# Patient Record
Sex: Female | Born: 1935 | Race: White | Hispanic: No | Marital: Married | Smoking: Former smoker
Health system: Southern US, Community
[De-identification: ages and names within clinical notes are randomized; demographics above are authoritative.]

## PROBLEM LIST (undated history)

## (undated) HISTORY — PX: APPENDECTOMY: SHX54

## (undated) HISTORY — PX: ABDOMINAL HYSTERECTOMY: SHX81

---

## 1998-12-22 ENCOUNTER — Ambulatory Visit (HOSPITAL_COMMUNITY): Admission: RE | Admit: 1998-12-22 | Discharge: 1998-12-22 | Payer: Self-pay | Admitting: Obstetrics & Gynecology

## 1999-03-16 ENCOUNTER — Other Ambulatory Visit: Admission: RE | Admit: 1999-03-16 | Discharge: 1999-03-16 | Payer: Self-pay | Admitting: Podiatry

## 2007-02-05 ENCOUNTER — Ambulatory Visit: Payer: Self-pay | Admitting: Urology

## 2007-10-10 ENCOUNTER — Ambulatory Visit: Payer: Self-pay | Admitting: Internal Medicine

## 2008-04-28 ENCOUNTER — Ambulatory Visit: Payer: Self-pay | Admitting: Unknown Physician Specialty

## 2008-12-02 ENCOUNTER — Ambulatory Visit: Payer: Self-pay | Admitting: Internal Medicine

## 2008-12-18 ENCOUNTER — Ambulatory Visit: Payer: Self-pay | Admitting: Otolaryngology

## 2009-04-05 ENCOUNTER — Emergency Department: Payer: Self-pay | Admitting: Internal Medicine

## 2009-04-12 ENCOUNTER — Emergency Department: Payer: Self-pay | Admitting: Internal Medicine

## 2010-01-18 ENCOUNTER — Emergency Department: Payer: Self-pay | Admitting: Emergency Medicine

## 2010-02-01 ENCOUNTER — Ambulatory Visit: Payer: Self-pay | Admitting: Internal Medicine

## 2011-03-29 ENCOUNTER — Ambulatory Visit: Payer: Self-pay | Admitting: Internal Medicine

## 2011-09-01 ENCOUNTER — Ambulatory Visit: Payer: Self-pay | Admitting: Internal Medicine

## 2011-10-12 ENCOUNTER — Ambulatory Visit: Payer: Self-pay | Admitting: Ophthalmology

## 2011-10-12 DIAGNOSIS — I10 Essential (primary) hypertension: Secondary | ICD-10-CM

## 2011-10-25 ENCOUNTER — Ambulatory Visit: Payer: Self-pay | Admitting: Ophthalmology

## 2011-11-09 ENCOUNTER — Ambulatory Visit: Payer: Self-pay | Admitting: Internal Medicine

## 2011-11-12 ENCOUNTER — Ambulatory Visit: Payer: Self-pay | Admitting: Internal Medicine

## 2011-11-23 ENCOUNTER — Ambulatory Visit: Payer: Self-pay | Admitting: Ophthalmology

## 2011-12-20 ENCOUNTER — Ambulatory Visit: Payer: Self-pay | Admitting: Ophthalmology

## 2012-01-02 ENCOUNTER — Ambulatory Visit: Payer: Self-pay | Admitting: Internal Medicine

## 2012-01-02 LAB — CANCER CENTER HEMOGLOBIN: HGB: 11 g/dL — ABNORMAL LOW (ref 12.0–16.0)

## 2012-01-13 ENCOUNTER — Ambulatory Visit: Payer: Self-pay | Admitting: Internal Medicine

## 2012-02-06 LAB — CANCER CENTER HEMOGLOBIN: HGB: 10.7 g/dL — ABNORMAL LOW (ref 12.0–16.0)

## 2012-02-10 ENCOUNTER — Ambulatory Visit: Payer: Self-pay | Admitting: Internal Medicine

## 2012-04-05 ENCOUNTER — Ambulatory Visit: Payer: Self-pay | Admitting: Internal Medicine

## 2012-04-05 LAB — IRON AND TIBC: Iron: 76 ug/dL (ref 50–170)

## 2012-04-05 LAB — FERRITIN: Ferritin (ARMC): 7 ng/mL — ABNORMAL LOW (ref 8–388)

## 2012-04-11 ENCOUNTER — Ambulatory Visit: Payer: Self-pay | Admitting: Internal Medicine

## 2012-11-21 ENCOUNTER — Ambulatory Visit: Payer: Self-pay | Admitting: Internal Medicine

## 2012-11-22 ENCOUNTER — Ambulatory Visit: Payer: Self-pay | Admitting: Internal Medicine

## 2013-11-25 ENCOUNTER — Ambulatory Visit: Payer: Self-pay | Admitting: Internal Medicine

## 2014-02-24 ENCOUNTER — Ambulatory Visit: Payer: Self-pay | Admitting: Unknown Physician Specialty

## 2014-02-26 LAB — PATHOLOGY REPORT

## 2014-04-29 DIAGNOSIS — E782 Mixed hyperlipidemia: Secondary | ICD-10-CM | POA: Insufficient documentation

## 2014-04-29 DIAGNOSIS — D649 Anemia, unspecified: Secondary | ICD-10-CM | POA: Insufficient documentation

## 2014-04-29 DIAGNOSIS — K219 Gastro-esophageal reflux disease without esophagitis: Secondary | ICD-10-CM | POA: Insufficient documentation

## 2014-04-29 DIAGNOSIS — F5104 Psychophysiologic insomnia: Secondary | ICD-10-CM | POA: Insufficient documentation

## 2014-05-20 DIAGNOSIS — R0602 Shortness of breath: Secondary | ICD-10-CM | POA: Insufficient documentation

## 2014-12-15 ENCOUNTER — Ambulatory Visit: Payer: Self-pay | Admitting: Internal Medicine

## 2014-12-15 LAB — D-DIMER(ARMC): D-Dimer: 605 ng/ml

## 2014-12-16 ENCOUNTER — Ambulatory Visit: Payer: Self-pay | Admitting: Internal Medicine

## 2015-01-06 DIAGNOSIS — R079 Chest pain, unspecified: Secondary | ICD-10-CM | POA: Insufficient documentation

## 2015-02-24 ENCOUNTER — Ambulatory Visit: Payer: Self-pay | Admitting: Internal Medicine

## 2015-04-05 NOTE — Op Note (Signed)
PATIENT NAME:  Tamara Levine, Tamara Levine MR#:  [id number] DATE OF BIRTH:  [birth year]  DATE OF PROCEDURE:  12/20/2011  PREOPERATIVE DIAGNOSIS: Visually significant cataract of the right eye.   POSTOPERATIVE DIAGNOSIS: Visually significant cataract of the right eye.   OPERATIVE PROCEDURE: Cataract extraction by phacoemulsification with implant of intraocular lens to right eye.   SURGEON: Birder Robson, MD.   ANESTHESIA:  1. Managed anesthesia care.  2. Topical tetracaine drops followed by 2% Xylocaine jelly applied in the preoperative holding area.   COMPLICATIONS: None.   TECHNIQUE: Stop and chop.    DESCRIPTION OF PROCEDURE: The patient was examined and consented in the preoperative holding area where the aforementioned topical anesthesia was applied to the right eye and then brought back to the Operating Room where the right eye was prepped and draped in the usual sterile ophthalmic fashion and a lid speculum was placed. A paracentesis was created with the side port blade and the anterior chamber was filled with viscoelastic. A near clear corneal incision was performed with the steel keratome. A continuous curvilinear capsulorrhexis was performed with a cystotome followed by the capsulorrhexis forceps. Hydrodissection and hydrodelineation were carried out with BSS on a blunt cannula. The lens was removed in a stop and chop technique and the remaining cortical material was removed with the irrigation-aspiration handpiece. The capsular bag was inflated with viscoelastic and the Technis ZCB00 24.0-diopter lens, serial number [id number] was placed in the capsular bag without complication. The remaining viscoelastic was removed from the eye with the irrigation-aspiration handpiece. The wounds were hydrated. The anterior chamber was flushed with Miostat and the eye was inflated to physiologic pressure. The wounds were found to be water tight. The eye was dressed with Vigamox and Omnipred. The patient was given  protective glasses to wear throughout the day and a shield with which to sleep tonight. The patient was also given drops with which to begin a drop regimen today and will follow-up with me in one day.    ____________________________ Livingston Diones. Cotton Beckley, MD wlp:ap D: 12/20/2011 13:46:33 ET T: 12/20/2011 14:08:13 ET JOB#: [id number]  cc: Kaleya Douse L. Taeveon Keesling, MD, <Dictator> Livingston Diones Caterina Racine MD ELECTRONICALLY SIGNED 12/27/2011 12:18

## 2015-04-27 DIAGNOSIS — I10 Essential (primary) hypertension: Secondary | ICD-10-CM | POA: Insufficient documentation

## 2015-05-07 DIAGNOSIS — I340 Nonrheumatic mitral (valve) insufficiency: Secondary | ICD-10-CM | POA: Insufficient documentation

## 2016-02-04 DIAGNOSIS — N183 Chronic kidney disease, stage 3 unspecified: Secondary | ICD-10-CM | POA: Insufficient documentation

## 2016-06-28 ENCOUNTER — Other Ambulatory Visit: Payer: Self-pay | Admitting: Internal Medicine

## 2016-06-28 DIAGNOSIS — Z1231 Encounter for screening mammogram for malignant neoplasm of breast: Secondary | ICD-10-CM

## 2016-07-05 ENCOUNTER — Other Ambulatory Visit: Payer: Self-pay | Admitting: Internal Medicine

## 2016-07-05 ENCOUNTER — Ambulatory Visit
Admission: RE | Admit: 2016-07-05 | Discharge: 2016-07-05 | Disposition: A | Discharge status: Home / Self Care | Payer: Medicare Other | Source: Ambulatory Visit | Attending: Internal Medicine | Admitting: Internal Medicine

## 2016-07-05 DIAGNOSIS — Z1231 Encounter for screening mammogram for malignant neoplasm of breast: Secondary | ICD-10-CM | POA: Diagnosis present

## 2016-07-07 ENCOUNTER — Other Ambulatory Visit: Payer: Self-pay | Admitting: Internal Medicine

## 2016-07-07 DIAGNOSIS — N6320 Unspecified lump in the left breast, unspecified quadrant: Secondary | ICD-10-CM

## 2016-07-19 ENCOUNTER — Ambulatory Visit
Admission: RE | Admit: 2016-07-19 | Discharge: 2016-07-19 | Disposition: A | Discharge status: Home / Self Care | Payer: Medicare Other | Source: Ambulatory Visit | Attending: Internal Medicine | Admitting: Internal Medicine

## 2016-07-19 DIAGNOSIS — N6320 Unspecified lump in the left breast, unspecified quadrant: Secondary | ICD-10-CM

## 2016-07-19 DIAGNOSIS — N63 Unspecified lump in breast: Secondary | ICD-10-CM | POA: Insufficient documentation

## 2016-08-05 DIAGNOSIS — R7309 Other abnormal glucose: Secondary | ICD-10-CM | POA: Insufficient documentation

## 2017-01-20 ENCOUNTER — Emergency Department: Payer: Medicare Other

## 2017-01-20 ENCOUNTER — Emergency Department
Admission: EM | Admit: 2017-01-20 | Discharge: 2017-01-20 | Disposition: A | Discharge status: Home / Self Care | Payer: Medicare Other | Attending: Emergency Medicine | Admitting: Emergency Medicine

## 2017-01-20 ENCOUNTER — Encounter: Payer: Self-pay | Admitting: Emergency Medicine

## 2017-01-20 DIAGNOSIS — Y939 Activity, unspecified: Secondary | ICD-10-CM | POA: Insufficient documentation

## 2017-01-20 DIAGNOSIS — M25571 Pain in right ankle and joints of right foot: Secondary | ICD-10-CM | POA: Insufficient documentation

## 2017-01-20 DIAGNOSIS — R930 Abnormal findings on diagnostic imaging of skull and head, not elsewhere classified: Secondary | ICD-10-CM | POA: Insufficient documentation

## 2017-01-20 DIAGNOSIS — Y999 Unspecified external cause status: Secondary | ICD-10-CM | POA: Insufficient documentation

## 2017-01-20 DIAGNOSIS — S0101XA Laceration without foreign body of scalp, initial encounter: Secondary | ICD-10-CM | POA: Diagnosis present

## 2017-01-20 DIAGNOSIS — Z87891 Personal history of nicotine dependence: Secondary | ICD-10-CM | POA: Diagnosis not present

## 2017-01-20 DIAGNOSIS — Y929 Unspecified place or not applicable: Secondary | ICD-10-CM | POA: Insufficient documentation

## 2017-01-20 DIAGNOSIS — W010XXA Fall on same level from slipping, tripping and stumbling without subsequent striking against object, initial encounter: Secondary | ICD-10-CM | POA: Diagnosis not present

## 2017-01-20 LAB — ETHANOL: Alcohol, Ethyl (B): 73 mg/dL — ABNORMAL HIGH (ref <5)

## 2017-01-20 MED ORDER — LIDOCAINE HCL (PF) 1 % IJ SOLN
INTRAMUSCULAR | Status: AC
  Filled 2017-01-20: qty 5

## 2017-01-20 NOTE — ED Provider Notes (Signed)
[facility] Emergency Department Provider Note    First MD Initiated Contact with Patient 01/20/17 [phone]     (approximate)  I have reviewed the triage vital signs and the nursing notes.   HISTORY  Chief Complaint Fall and Ankle Pain   HPI Tamara Levine is a 81 y.o. female presents via EMS status post fall. Patient states that she just tripped and fell twice with resultant injury both times. Patient states that she had 2 alcoholic beverages tonight as well as took her nighttime dose of Ambien before falling.   Past medical history No pertinent past medical history There are no active problems to display for this patient.   Past Surgical History:  Procedure Laterality Date  . ABDOMINAL HYSTERECTOMY    . APPENDECTOMY      Prior to Admission medications   Not on File    Allergies Levofloxacin; Penicillins; and Sulfa antibiotics  History reviewed. No pertinent family history.  Social History Social History  Substance Use Topics  . Smoking status: Former Smoker    Quit date: 12/12/1990  . Smokeless tobacco: Never Used  . Alcohol use Yes     Comment: 2-3 drinks    Review of Systems Constitutional: No fever/chills Eyes: No visual changes. ENT: No sore throat. Cardiovascular: Denies chest pain. Respiratory: Denies shortness of breath. Gastrointestinal: No abdominal pain.  No nausea, no vomiting.  No diarrhea.  No constipation. Genitourinary: Negative for dysuria. Musculoskeletal: Negative for back pain. Skin: Negative for rash.Positive for scalp laceration Neurological: Negative for headaches, focal weakness or numbness.  10-point ROS otherwise negative.  ____________________________________________   PHYSICAL EXAM:  VITAL SIGNS: ED Triage Vitals  Enc Vitals Group     BP 01/20/17 0312 116/74     Pulse Rate 01/20/17 0312 74     Resp 01/20/17 0312 18     Temp 01/20/17 0312 97.8 F (36.6 C)     Temp Source 01/20/17 0312 Oral   SpO2 01/20/17 0312 95 %     Weight 01/20/17 0313 156 lb (70.8 kg)     Height 01/20/17 0313 5\' 4"  (1.626 m)     Head Circumference --      Peak Flow --      Pain Score 01/20/17 0314 5     Pain Loc --      Pain Edu? --      Excl. in [place]? --     Constitutional: Alert and oriented. Well appearing and in no acute distress. Eyes: Conjunctivae are normal. PERRL. EOMI. Head: 9 cm linear occipital parietal scalp laceration. Mouth/Throat: Mucous membranes are moist Neck: No stridor.  Cardiovascular: Normal rate, regular rhythm. Good peripheral circulation. Grossly normal heart sounds. Respiratory: Normal respiratory effort.  No retractions. Lungs CTAB. Gastrointestinal: Soft and nontender. No distention.  Musculoskeletal: No lower extremity tenderness nor edema. No gross deformities of extremities. Neurologic:  Normal speech and language. No gross focal neurologic deficits are appreciated.  Skin:  9 cm linear occipital parietal scalp laceration.   ____________________________________________   LABS (all labs ordered are listed, but only abnormal results are displayed)  Labs Reviewed  ETHANOL - Abnormal; Notable for the following:       Result Value   Alcohol, Ethyl (B) 73 (*)    All other components within normal limits   ____________________________________________  EKG  ED ECG REPORT I, [place] N Meziah Blasingame, the attending physician, personally viewed and interpreted this ECG.   Date: 01/20/2017  EKG Time: 3:16 AM  Rate:  73  Rhythm: Normal sinus rhythm  Axis: Normal  Intervals: Normal  ST&T Change: None ____________________________________________  RADIOLOGY I, [place] N Salaya [place], personally viewed and evaluated these images (plain radiographs) as part of my medical decision making, as well as reviewing the written report by the radiologist.  Dg Ankle Complete Right  Result Date: 01/20/2017 CLINICAL DATA:  81 y/o  F; status post fall with right ankle pain. EXAM: RIGHT ANKLE -  COMPLETE 3+ VIEW COMPARISON:  None. FINDINGS: There is no evidence of fracture, dislocation, or joint effusion. There is no evidence of arthropathy or other focal bone abnormality. Dorsal calcaneal enthesophyte. Mild soft tissue swelling about the ankle joint. Ankle mortise is symmetric on these nonstress views. Talar dome is intact. IMPRESSION: No acute fracture or dislocation identified. Electronically Signed   By: Kristine Garbe M.D.   On: 01/20/2017 04:41   Ct Head Wo Contrast  Result Date: 01/20/2017 CLINICAL DATA:  Fall in shower 1.5 hours ago. Laceration to the top of the head. EXAM: CT HEAD WITHOUT CONTRAST CT CERVICAL SPINE WITHOUT CONTRAST TECHNIQUE: Multidetector CT imaging of the head and cervical spine was performed following the standard protocol without intravenous contrast. Multiplanar CT image reconstructions of the cervical spine were also generated. COMPARISON:  CT head 04/05/2009 FINDINGS: CT HEAD FINDINGS Brain: Diffuse cerebral atrophy. Ventricular dilatation consistent with central atrophy. Low-attenuation changes in the deep white matter consistent with small vessel ischemia. No mass effect or midline shift. No abnormal extra-axial fluid collections. Gray-white matter junctions are distinct. Basal cisterns are not effaced. No acute intracranial hemorrhage Vascular: No hyperdense vessel or unexpected calcification. Skull: Normal. Negative for fracture or focal lesion. Sinuses/Orbits: No acute finding. Other: Soft tissue gas consistent with subcutaneous scalp laceration over the posterior vertex. CT CERVICAL SPINE FINDINGS Alignment: Reversal of the usual cervical lordosis with slight anterior subluxations at C3-4 and C4-5. This is likely degenerative but ligamentous injury or muscle spasm could also have this appearance and are not entirely excluded. Normal alignment of the facet joints. C1-2 articulation appears intact. Skull base and vertebrae: No acute fracture. No primary bone  lesion or focal pathologic process. Soft tissues and spinal canal: No prevertebral fluid or swelling. No visible canal hematoma. Disc levels: Degenerative changes in the cervical spine with narrowed disc spaces and endplate hypertrophic changes most prominent at C4-5, C5-6, and C6-7 levels. Degenerative changes in the facet joints. Upper chest: Emphysematous changes and scarring in the lung apices. Other: None. IMPRESSION: No acute intracranial abnormalities. Chronic atrophy and small vessel ischemic changes. Nonspecific reversal of the usual cervical lordosis. Degenerative changes in the cervical spine. No acute displaced fractures are identified. Electronically Signed   By: Lucienne Capers M.D.   On: 01/20/2017 03:44   Ct Cervical Spine Wo Contrast  Result Date: 01/20/2017 CLINICAL DATA:  Fall in shower 1.5 hours ago. Laceration to the top of the head. EXAM: CT HEAD WITHOUT CONTRAST CT CERVICAL SPINE WITHOUT CONTRAST TECHNIQUE: Multidetector CT imaging of the head and cervical spine was performed following the standard protocol without intravenous contrast. Multiplanar CT image reconstructions of the cervical spine were also generated. COMPARISON:  CT head 04/05/2009 FINDINGS: CT HEAD FINDINGS Brain: Diffuse cerebral atrophy. Ventricular dilatation consistent with central atrophy. Low-attenuation changes in the deep white matter consistent with small vessel ischemia. No mass effect or midline shift. No abnormal extra-axial fluid collections. Gray-white matter junctions are distinct. Basal cisterns are not effaced. No acute intracranial hemorrhage Vascular: No hyperdense vessel or unexpected calcification. Skull: Normal.  Negative for fracture or focal lesion. Sinuses/Orbits: No acute finding. Other: Soft tissue gas consistent with subcutaneous scalp laceration over the posterior vertex. CT CERVICAL SPINE FINDINGS Alignment: Reversal of the usual cervical lordosis with slight anterior subluxations at C3-4 and  C4-5. This is likely degenerative but ligamentous injury or muscle spasm could also have this appearance and are not entirely excluded. Normal alignment of the facet joints. C1-2 articulation appears intact. Skull base and vertebrae: No acute fracture. No primary bone lesion or focal pathologic process. Soft tissues and spinal canal: No prevertebral fluid or swelling. No visible canal hematoma. Disc levels: Degenerative changes in the cervical spine with narrowed disc spaces and endplate hypertrophic changes most prominent at C4-5, C5-6, and C6-7 levels. Degenerative changes in the facet joints. Upper chest: Emphysematous changes and scarring in the lung apices. Other: None. IMPRESSION: No acute intracranial abnormalities. Chronic atrophy and small vessel ischemic changes. Nonspecific reversal of the usual cervical lordosis. Degenerative changes in the cervical spine. No acute displaced fractures are identified. Electronically Signed   By: Lucienne Capers M.D.   On: 01/20/2017 03:44    ____________________________________________   .Marland KitchenLaceration Repair Date/Time: 01/20/2017 6:04 AM Performed by: Gregor Hams Authorized by: Gregor Hams   Consent:    Consent obtained:  Verbal   Consent given by:  Patient   Risks discussed:  Infection and pain   Alternatives discussed:  No treatment Anesthesia (see MAR for exact dosages):    Anesthesia method:  Local infiltration   Local anesthetic:  Lidocaine 1% w/o epi Laceration details:    Location:  Scalp   Scalp location:  Occipital   Length (cm):  9   Depth (mm):  3 Repair type:    Repair type:  Simple Exploration:    Contaminated: no   Treatment:    Area cleansed with:  Betadine and saline   Amount of cleaning:  Standard   Visualized foreign bodies/material removed: no   Skin repair:    Repair method:  Staples   Number of staples:  9 Post-procedure details:    Patient tolerance of procedure:  Tolerated well, no immediate  complications       INITIAL IMPRESSION / ASSESSMENT AND PLAN / ED COURSE  Pertinent labs & imaging results that were available during my care of the patient were reviewed by me and considered in my medical decision making (see chart for details).        ____________________________________________  FINAL CLINICAL IMPRESSION(S) / ED DIAGNOSES  Final diagnoses:  Scalp laceration, initial encounter     MEDICATIONS GIVEN DURING THIS VISIT:  Medications  lidocaine (PF) (XYLOCAINE) 1 % injection (not administered)  lidocaine (PF) (XYLOCAINE) 1 % injection (not administered)     NEW OUTPATIENT MEDICATIONS STARTED DURING THIS VISIT:  New Prescriptions   No medications on file    Modified Medications   No medications on file    Discontinued Medications   No medications on file     Note:  This document was prepared using Dragon voice recognition software and may include unintentional dictation errors.    Gregor Hams, MD 01/20/17 [phone]

## 2017-01-20 NOTE — ED Triage Notes (Signed)
Pt coming from home via EMS for fall in shower about 1.5 hrs ago. HX of ETOH and falling. Denies LOC. Laceration on top of head and complains of right ankle pain.

## 2017-01-26 ENCOUNTER — Encounter: Payer: Self-pay | Admitting: *Deleted

## 2017-01-26 ENCOUNTER — Emergency Department
Admission: EM | Admit: 2017-01-26 | Discharge: 2017-01-26 | Disposition: A | Discharge status: Home / Self Care | Payer: Medicare Other | Attending: Emergency Medicine | Admitting: Emergency Medicine

## 2017-01-26 DIAGNOSIS — Z4802 Encounter for removal of sutures: Secondary | ICD-10-CM | POA: Diagnosis present

## 2017-01-26 DIAGNOSIS — Z87891 Personal history of nicotine dependence: Secondary | ICD-10-CM | POA: Diagnosis not present

## 2017-01-26 NOTE — ED Triage Notes (Signed)
States she is here for staple removal, staples were placed 7 days ago

## 2017-01-26 NOTE — ED Provider Notes (Signed)
[facility] Emergency Department Provider Note  ____________________________________________  Time seen: Approximately 1:18 PM  I have reviewed the triage vital signs and the nursing notes.   HISTORY  Chief Complaint Suture / Staple Removal    HPI Tamara Levine is a 81 y.o. female , NAD, presents to the emergency department for staple removal. Patient states she was seen in this emergency department approximately one week ago after a fall. Sustained a laceration to her head and staples were placed.  States over the last week she has had no headache, visual changes, lightheadedness or dizziness. No abdominal pain, nausea or vomiting. No numbness, weakness or tingling. Denies any neck pain. Has not noted any oozing, weeping or bleeding from the laceration site.Has had no fevers, chills or body aches. States she is here for staple removal and has no other complaints to be evaluated at this time.   History reviewed. No pertinent past medical history.  There are no active problems to display for this patient.   Past Surgical History:  Procedure Laterality Date  . ABDOMINAL HYSTERECTOMY    . APPENDECTOMY      Prior to Admission medications   Not on File    Allergies Levofloxacin; Penicillins; and Sulfa antibiotics  History reviewed. No pertinent family history.  Social History Social History  Substance Use Topics  . Smoking status: Former Smoker    Quit date: 12/12/1990  . Smokeless tobacco: Never Used  . Alcohol use Yes     Comment: 2-3 drinks     Review of Systems  Constitutional: No fever/chills Eyes: No visual changes. Gastrointestinal: No abdominal pain.  No nausea, vomiting.  Musculoskeletal: Negative for Neck pain.  Skin: Positive scalp laceration with 9 staples in place. Negative for swelling, oozing, weeping, bleeding. Neurological: Negative for headaches, Lightheadedness or dizziness.. 10-point ROS otherwise  negative.  ____________________________________________   PHYSICAL EXAM:  VITAL SIGNS: ED Triage Vitals  Enc Vitals Group     BP 01/26/17 1209 (!) 120/57     Pulse Rate 01/26/17 1209 92     Resp 01/26/17 1209 18     Temp 01/26/17 1209 98 F (36.7 C)     Temp Source 01/26/17 1209 Oral     SpO2 01/26/17 1209 96 %     Weight 01/26/17 1208 156 lb (70.8 kg)     Height 01/26/17 1208 5\' 4"  (1.626 m)     Head Circumference --      Peak Flow --      Pain Score 01/26/17 1208 0     Pain Loc --      Pain Edu? --      Excl. in [place]? --      Constitutional: Alert and oriented. Well appearing and in no acute distress. Eyes: Conjunctivae are normal.  Head: Laceration noted about the central, superior scalp with 9 staples in place. Surrounding eschar is noted but no active bleeding, oozing or weeping. Area is mildly tender but no fluctuance or induration noted. Normocephalic. Neck: Supple with full range of motion. Hematological/Lymphatic/Immunilogical: No cervical lymphadenopathy. Cardiovascular:  Good peripheral circulation. Respiratory: Normal respiratory effort without tachypnea or retractions.  Neurologic:  Normal speech and language. No gross focal neurologic deficits are appreciated.  Skin:  Skin is warm, dry. No rash noted. Psychiatric: Mood and affect are normal. Speech and behavior are normal. Patient exhibits appropriate insight and judgement.   ____________________________________________   LABS  None ____________________________________________  EKG  None ____________________________________________  RADIOLOGY  None ____________________________________________  PROCEDURES  Procedure(s) performed: None   .Suture Removal Date/Time: 01/26/2017 1:18 PM Performed by: Mannie Stabile, Reeva Davern L Authorized by: Natale Milch L   Consent:    Consent obtained:  Verbal   Consent given by:  Patient   Risks discussed:  Bleeding and pain   Alternatives discussed:  No  treatment Location:    Location:  Head/neck   Head/neck location:  Scalp Procedure details:    Wound appearance:  No signs of infection and good wound healing   Number of sutures removed:  0   Number of staples removed:  9 Post-procedure details:    Post-removal:  No dressing applied   Patient tolerance of procedure:  Tolerated well, no immediate complications     Medications - No data to display   ____________________________________________   INITIAL IMPRESSION / ASSESSMENT AND PLAN / ED COURSE  Pertinent labs & imaging results that were available during my care of the patient were reviewed by me and considered in my medical decision making (see chart for details).     Patient's diagnosis is consistent with encounter for staple removal. Patient tolerated the procedure well without any immediate adverse events. Patient will be discharged home with instructions to cleanse the area with warm soapy water daily. May take Tylenol or ibuprofen as needed for pain. Patient is to follow up with her primary care provider as needed. Patient is given ED precautions to return to the ED for any worsening or new symptoms.   ____________________________________________  FINAL CLINICAL IMPRESSION(S) / ED DIAGNOSES  Final diagnoses:  Encounter for staple removal      NEW MEDICATIONS STARTED DURING THIS VISIT:  There are no discharge medications for this patient.        Braxton Feathers, PA-C 01/27/17 1212    Lavonia Drafts, MD 01/27/17 [phone]

## 2017-03-20 ENCOUNTER — Ambulatory Visit (INDEPENDENT_AMBULATORY_CARE_PROVIDER_SITE_OTHER): Payer: Medicare Other | Admitting: Podiatry

## 2017-03-20 ENCOUNTER — Ambulatory Visit (INDEPENDENT_AMBULATORY_CARE_PROVIDER_SITE_OTHER): Payer: Medicare Other

## 2017-03-20 ENCOUNTER — Encounter: Payer: Self-pay | Admitting: Podiatry

## 2017-03-20 VITALS — BP 118/63 | HR 93 | Resp 16

## 2017-03-20 DIAGNOSIS — M25571 Pain in right ankle and joints of right foot: Secondary | ICD-10-CM | POA: Diagnosis not present

## 2017-03-20 DIAGNOSIS — S8254XA Nondisplaced fracture of medial malleolus of right tibia, initial encounter for closed fracture: Secondary | ICD-10-CM | POA: Diagnosis not present

## 2017-03-20 NOTE — Progress Notes (Signed)
   Subjective:    Patient ID: Tamara Levine, female    DOB: 04-Jun-1936, 81 y.o.   MRN: [id number]  HPI: She presents today she complained of a painful swollen ankle to the right. She states that she fell in 2018 February and went to the ER. She states that x-rays were taking and they told her to rapid and an Ace wrap that there was nothing broken. She states that the pain is a lot less now but there is still considerable swelling.  Review of Systems  All other systems reviewed and are negative.      Objective:   Physical Exam: Vital signs are stable alert and oriented 3. Pulses are palpable. Neurologic sensorium is intact. Degenerative flexors are intact. Mild edema pitting in nature no ecchymosis and no erythema no cellulitis drainage or odor. No open wounds. Radiographs 3 views of the ankle and 2 views of the foot were taken today demonstrating normal osseous architecture with exception of a crack in the medial malleolus with minimal displacement. The fibula just at the level of the malleolus also demonstrates a fracture with no displacement. Continues evaluation demonstrates supple well-hydrated cutis no erythema edema cellulitis drainage or odor.        Assessment & Plan:  Assessment: Fracture medial malleolus. Mild edema.  Plan: I recommended that she continue to wear compression garment and good sturdy shoes. She should be far enough out at this point that she should not have an issue. I will follow up with her on an as-needed basis.

## 2017-04-28 ENCOUNTER — Other Ambulatory Visit: Payer: Self-pay | Admitting: Internal Medicine

## 2017-04-28 DIAGNOSIS — R1032 Left lower quadrant pain: Principal | ICD-10-CM

## 2017-04-28 DIAGNOSIS — R1031 Right lower quadrant pain: Secondary | ICD-10-CM

## 2017-05-25 ENCOUNTER — Ambulatory Visit
Admission: RE | Admit: 2017-05-25 | Discharge: 2017-05-25 | Disposition: A | Discharge status: Home / Self Care | Payer: Medicare Other | Source: Ambulatory Visit | Attending: Internal Medicine | Admitting: Internal Medicine

## 2017-05-25 DIAGNOSIS — R1031 Right lower quadrant pain: Secondary | ICD-10-CM | POA: Diagnosis not present

## 2017-05-25 DIAGNOSIS — R1032 Left lower quadrant pain: Secondary | ICD-10-CM | POA: Insufficient documentation

## 2018-01-17 IMAGING — DX DG ANKLE COMPLETE 3+V*R*
3 series · 3 of 3 positions shown · non-contrast
Comparison: None.

CLINICAL DATA: 81 y/o  F; status post fall with right ankle pain.

EXAM:
RIGHT ANKLE - COMPLETE 3+ VIEW

[ankle ap]
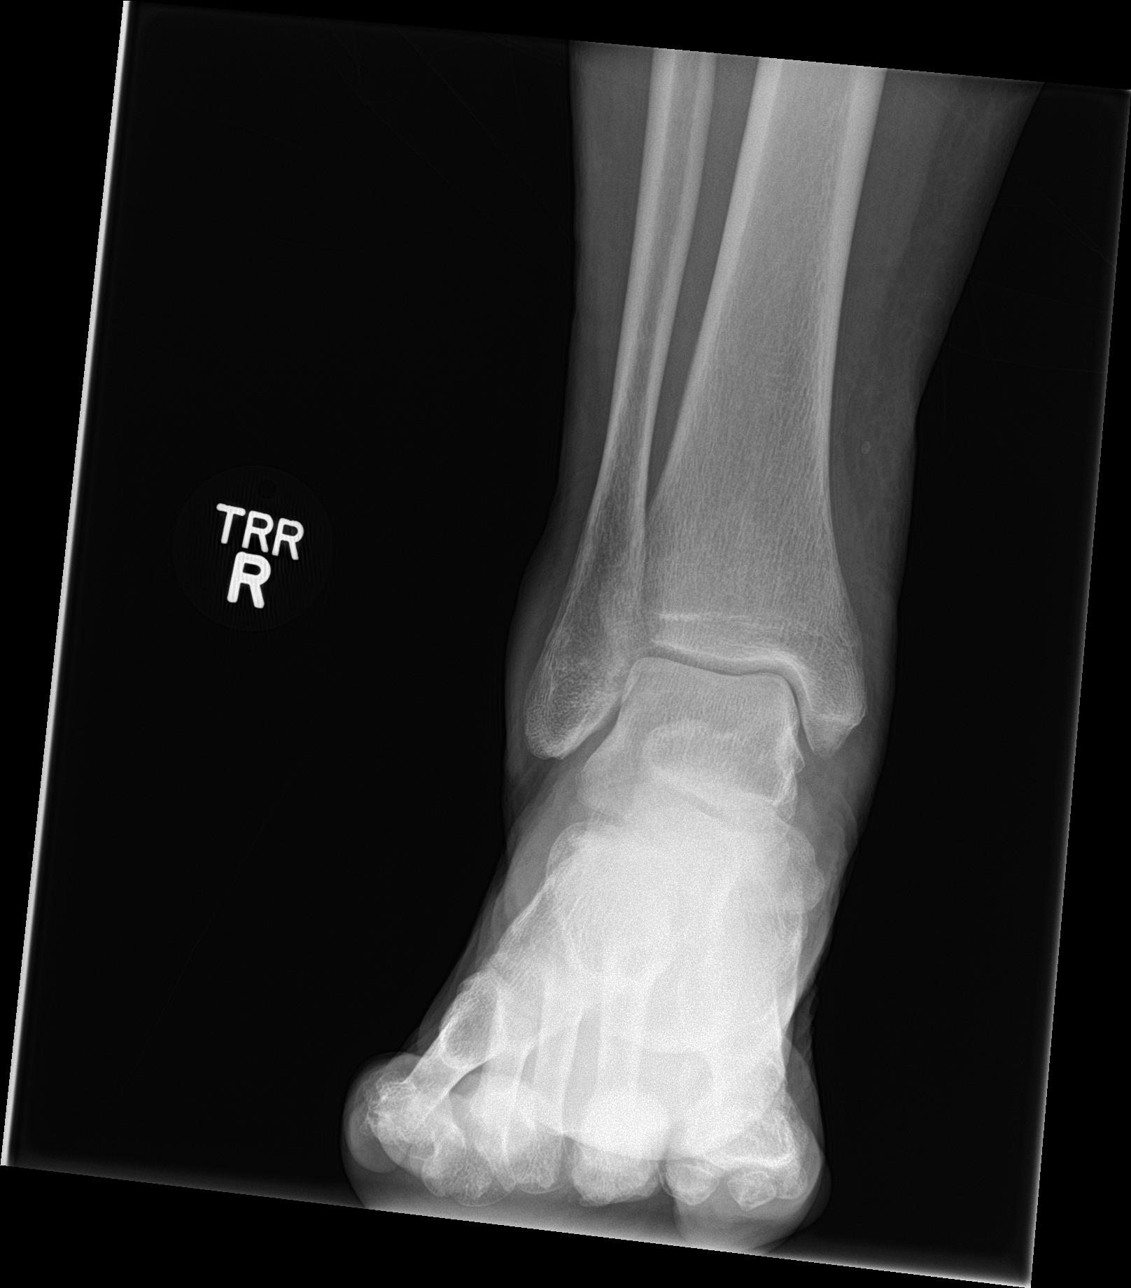

[ankle obl]
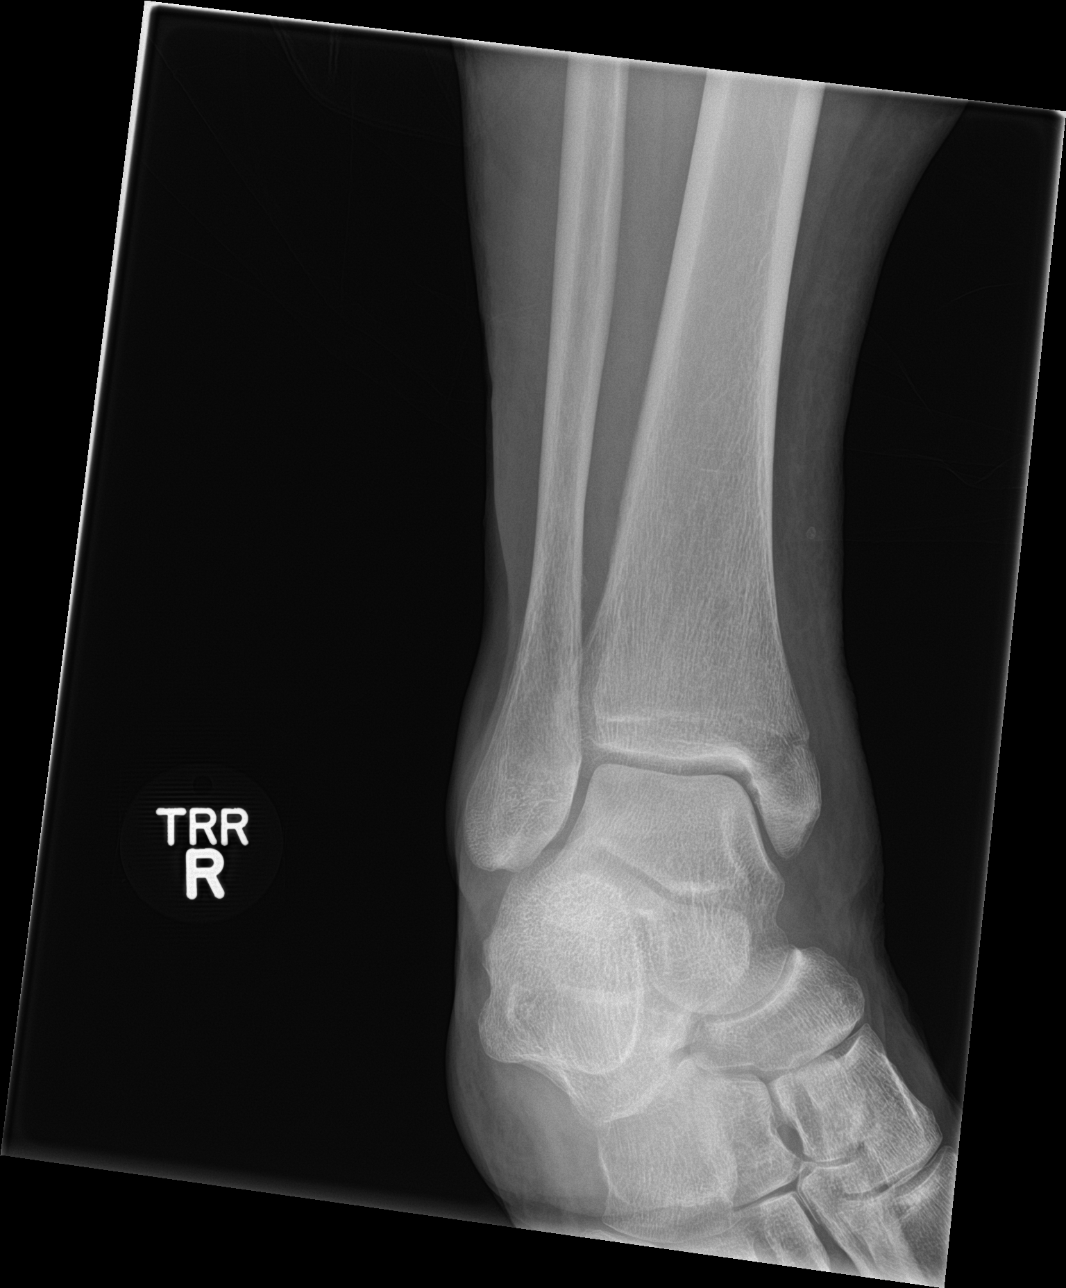

[ankle lat]
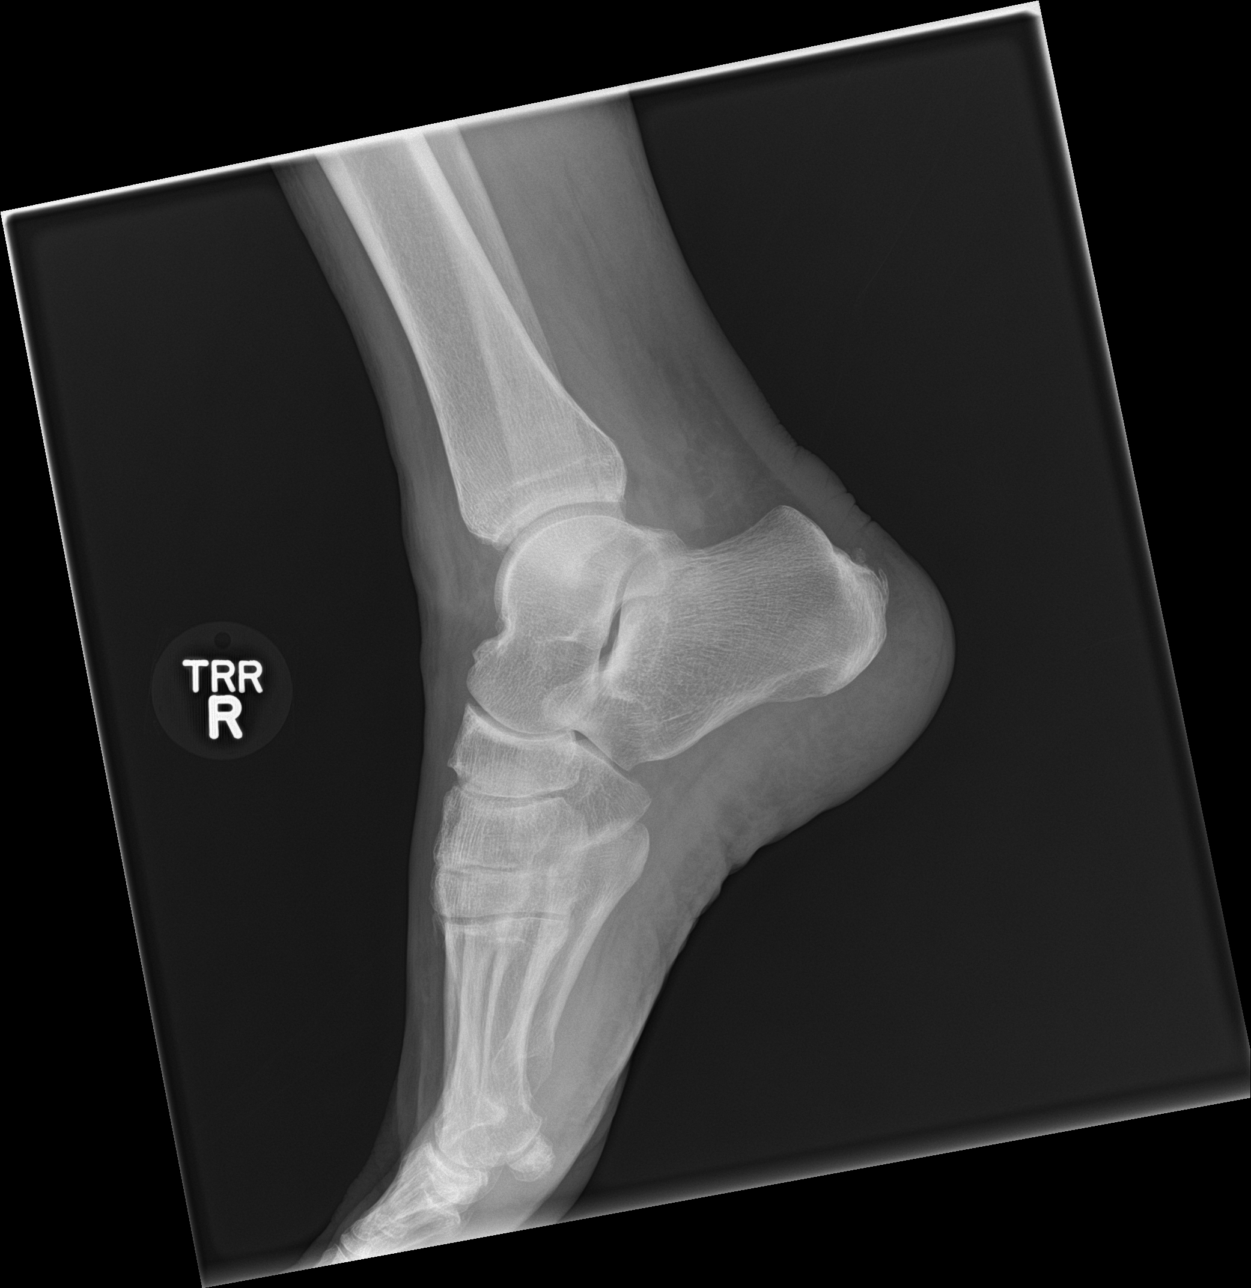

[3 of 3 positions shown; findings below may reference images not displayed]

FINDINGS: There is no evidence of fracture, dislocation, or joint effusion.
There is no evidence of arthropathy or other focal bone abnormality.
Dorsal calcaneal enthesophyte. Mild soft tissue swelling about the
ankle joint. Ankle mortise is symmetric on these nonstress views.
Talar dome is intact.
IMPRESSION: No acute fracture or dislocation identified.

By: Mackentoche Dalge M.D.

## 2018-06-12 ENCOUNTER — Emergency Department
Admission: EM | Admit: 2018-06-12 | Discharge: 2018-06-12 | Disposition: A | Discharge status: Home / Self Care | Payer: Medicare Other | Attending: Emergency Medicine | Admitting: Emergency Medicine

## 2018-06-12 ENCOUNTER — Encounter: Payer: Self-pay | Admitting: Emergency Medicine

## 2018-06-12 ENCOUNTER — Emergency Department: Payer: Medicare Other

## 2018-06-12 DIAGNOSIS — W01198A Fall on same level from slipping, tripping and stumbling with subsequent striking against other object, initial encounter: Secondary | ICD-10-CM | POA: Diagnosis not present

## 2018-06-12 DIAGNOSIS — Y929 Unspecified place or not applicable: Secondary | ICD-10-CM | POA: Diagnosis not present

## 2018-06-12 DIAGNOSIS — N183 Chronic kidney disease, stage 3 (moderate): Secondary | ICD-10-CM | POA: Diagnosis not present

## 2018-06-12 DIAGNOSIS — S0181XA Laceration without foreign body of other part of head, initial encounter: Secondary | ICD-10-CM

## 2018-06-12 DIAGNOSIS — Y999 Unspecified external cause status: Secondary | ICD-10-CM | POA: Insufficient documentation

## 2018-06-12 DIAGNOSIS — S01112A Laceration without foreign body of left eyelid and periocular area, initial encounter: Secondary | ICD-10-CM | POA: Insufficient documentation

## 2018-06-12 DIAGNOSIS — Z87891 Personal history of nicotine dependence: Secondary | ICD-10-CM | POA: Insufficient documentation

## 2018-06-12 DIAGNOSIS — Y939 Activity, unspecified: Secondary | ICD-10-CM | POA: Diagnosis not present

## 2018-06-12 DIAGNOSIS — S0083XA Contusion of other part of head, initial encounter: Secondary | ICD-10-CM

## 2018-06-12 DIAGNOSIS — Z79899 Other long term (current) drug therapy: Secondary | ICD-10-CM | POA: Diagnosis not present

## 2018-06-12 DIAGNOSIS — W19XXXA Unspecified fall, initial encounter: Secondary | ICD-10-CM

## 2018-06-12 MED ORDER — LIDOCAINE HCL (PF) 1 % IJ SOLN
INTRAMUSCULAR | Status: AC
  Administered 2018-06-12: 13:00:00
  Filled 2018-06-12: qty 5

## 2018-06-12 NOTE — ED Notes (Signed)
Pt was walking into kitchen and did not turn the light on/she tripped over a fan that she had placed in the kitchen at 9pm last night - Left eye hit microwave cabinet - Nose hit pet crate door - Knees hit the floor Pt denies loss of consciousness - denies n/v - denies dizziness - denies vision changes  Reports headache

## 2018-06-12 NOTE — ED Triage Notes (Signed)
Pt arrived after mechanical fall yesterday. Pt states she tripped over small fan in the floor that resulted in her falling and hitting her face. Pt denies any loc. Pt has bruising and laceration to face. Laceration above left eye and on her nose. Pt states she iced the area last night and called her doctor today who told her he ws out of the office and should come in to the ED to be seen. Pt alert and oriented and I NAD

## 2018-06-12 NOTE — ED Provider Notes (Signed)
[facility] Emergency Department Provider Note   ____________________________________________    I have reviewed the triage vital signs and the nursing notes.   HISTORY  Chief Complaint Fall     HPI Tamara Levine is a 82 y.o. female who presents for a fall.  Patient reports she tripped over an object last night at approximately 8 PM and fell forward and struck her head against a microwave cabinet.  Denies LOC.  Denies neuro deficits.  Not on blood thinners.  Tetanus is up-to-date.  Suffered a laceration.  Called her dermatologist today who referred her to the emergency department.  Overall feels well, has some mild no swelling, no epistaxis.   History reviewed. No pertinent past medical history.  Patient Active Problem List   Diagnosis Date Noted  . Abnormal glucose 08/05/2016  . CKD (chronic kidney disease) stage 3, GFR 30-59 ml/min (HCC) 02/04/2016  . Moderate mitral insufficiency 05/07/2015  . Benign essential hypertension 04/27/2015  . Chest pain 01/06/2015  . SOB (shortness of breath) 05/20/2014  . Anemia 04/29/2014  . Chronic insomnia 04/29/2014  . GERD (gastroesophageal reflux disease) 04/29/2014  . Mixed hyperlipidemia 04/29/2014    Past Surgical History:  Procedure Laterality Date  . ABDOMINAL HYSTERECTOMY    . APPENDECTOMY      Prior to Admission medications   Medication Sig Start Date End Date Taking? Authorizing Provider  amLODipine (NORVASC) 5 MG tablet  01/14/17   [provider]  aspirin EC 81 MG tablet Take by mouth.    [provider]  atorvastatin (LIPITOR) 40 MG tablet  02/16/17   [provider]  citalopram (CELEXA) 20 MG tablet  01/25/17   [provider]  fluticasone (FLONASE) 50 MCG/ACT nasal spray USE TWO SPRAYS IN EACH NOSTRIL EVERY DAY 06/01/16   [provider]  omeprazole (PRILOSEC) 40 MG capsule  02/16/17   [provider]  zolpidem (AMBIEN) 10 MG tablet  02/06/17    [provider]     Allergies Hydrocodone-acetaminophen; Levofloxacin; Penicillins; and Sulfa antibiotics  No family history on file.  Social History Social History   Tobacco Use  . Smoking status: Former Smoker    Last attempt to quit: 12/12/1990    Years since quitting: 27.5  . Smokeless tobacco: Never Used  Substance Use Topics  . Alcohol use: Yes    Comment: 2-3 drinks  . Drug use: No    Review of Systems  Constitutional: No dizziness Eyes: No visual changes.  ENT: No neck pain Cardiovascular: Denies chest pain. Respiratory: Denies shortness of breath. Gastrointestinal: No nausea or vomiting  Musculoskeletal: No extremity injuries Skin: Laceration to the face Neurological: Negative for headaches or weakness   ____________________________________________   PHYSICAL EXAM:  VITAL SIGNS: ED Triage Vitals  Enc Vitals Group     BP 06/12/18 1052 138/79     Pulse Rate 06/12/18 1052 72     Resp 06/12/18 1052 16     Temp 06/12/18 1052 98 F (36.7 C)     Temp Source 06/12/18 1052 Oral     SpO2 06/12/18 1052 95 %     Weight 06/12/18 1053 72.6 kg (160 lb)     Height 06/12/18 1053 1.6 m (5\' 3" )     Head Circumference --      Peak Flow --      Pain Score 06/12/18 1053 3     Pain Loc --      Pain Edu? --  Excl. in [place]? --     Constitutional: Alert and oriented. No acute distress. Pleasant and interactive Eyes: Conjunctivae are normal.  Approximately 5 cm laceration above the left eye, only 2 cm is gaping on the medial portion.  2 cm laceration across the bridge of the nose, well approximated Head: As above Nose: Mild swelling, no septal hematoma Mouth/Throat: Mucous membranes are moist.   Neck:  Painless ROM  Respiratory: Normal respiratory effort.  No retractions. Gastrointestinal: Soft and nontender. No distention.    Musculoskeletal: No lower extremity tenderness nor edema.  Warm and well perfused Neurologic:  Normal speech and language. No  gross focal neurologic deficits are appreciated.  Skin:  Skin is warm, dry Psychiatric: Mood and affect are normal. Speech and behavior are normal.  ____________________________________________   LABS (all labs ordered are listed, but only abnormal results are displayed)  Labs Reviewed - No data to display ____________________________________________  EKG  None ____________________________________________  RADIOLOGY  CT max face negative for acute facial fractures ____________________________________________   PROCEDURES  Procedure(s) performed: yes  .Marland KitchenLaceration Repair Date/Time: 06/12/2018 12:42 PM Performed by: Lavonia Drafts, MD Authorized by: Lavonia Drafts, MD   Consent:    Consent obtained:  Verbal   Consent given by:  Patient   Risks discussed:  Infection, pain, retained foreign body, poor cosmetic result and poor wound healing Anesthesia (see MAR for exact dosages):    Anesthesia method:  Local infiltration   Local anesthetic:  Lidocaine 1% w/o epi Laceration details:    Location:  Face   Face location:  L upper eyelid   Extent:  Partial thickness   Length (cm):  2 Repair type:    Repair type:  Simple Exploration:    Hemostasis achieved with:  Direct pressure   Wound exploration: entire depth of wound probed and visualized     Contaminated: no   Treatment:    Area cleansed with:  Saline   Amount of cleaning:  Extensive   Irrigation solution:  Sterile saline   Visualized foreign bodies/material removed: no   Skin repair:    Repair method:  Sutures   Suture size:  5-0   Suture material:  Nylon   Suture technique:  Simple interrupted   Number of sutures:  1 Approximation:    Approximation:  Close Post-procedure details:    Dressing:  Sterile dressing   Patient tolerance of procedure:  Tolerated well, no immediate complications     Critical Care performed: No ____________________________________________   INITIAL IMPRESSION / ASSESSMENT AND  PLAN / ED COURSE  Pertinent labs & imaging results that were available during my care of the patient were reviewed by me and considered in my medical decision making (see chart for details).  Patient presents with laceration to the left orbit, head injury, overall well-appearing in no acute distress.  Injury occurred greater than 12 hours ago however given that one portion of the laceration is gaping we will loosely approximate this.  CT max face negative for acute facial fractures.  Tetanus up-to-date.  Remove suture in 5 days    ____________________________________________   FINAL CLINICAL IMPRESSION(S) / ED DIAGNOSES  Final diagnoses:  Fall, initial encounter  Contusion of face, initial encounter  Facial laceration, initial encounter        Note:  This document was prepared using Dragon voice recognition software and may include unintentional dictation errors.    Lavonia Drafts, MD 06/12/18 1245

## 2018-06-12 NOTE — ED Notes (Signed)
Patient transported to CT 

## 2018-10-31 ENCOUNTER — Encounter

## 2018-10-31 ENCOUNTER — Ambulatory Visit (INDEPENDENT_AMBULATORY_CARE_PROVIDER_SITE_OTHER): Payer: Medicare Other

## 2018-10-31 ENCOUNTER — Encounter: Payer: Self-pay | Admitting: Podiatry

## 2018-10-31 ENCOUNTER — Ambulatory Visit (INDEPENDENT_AMBULATORY_CARE_PROVIDER_SITE_OTHER): Payer: Medicare Other | Admitting: Podiatry

## 2018-10-31 DIAGNOSIS — S9002XA Contusion of left ankle, initial encounter: Secondary | ICD-10-CM | POA: Diagnosis not present

## 2018-10-31 DIAGNOSIS — S8265XA Nondisplaced fracture of lateral malleolus of left fibula, initial encounter for closed fracture: Secondary | ICD-10-CM | POA: Diagnosis not present

## 2018-10-31 NOTE — Progress Notes (Signed)
  Subjective:  Patient ID: Tamara Levine, female    DOB: 13-Apr-1936,  MRN: [id number] HPI Chief Complaint  Patient presents with  . Ankle Pain    Patient presents today for left ankle injury 2 weeks ago.  She states she twisted her ankle stepping out of her car, since then, it has been  getting worse, swelling, painful to walk.  She reports a sharp pains but constant ache that radiates up achilles to back of leg    She has been icing, elevating and occational Aleve with slight relief    82 y.o. female presents with the above complaint.   ROS: Denies fever chills nausea vomiting muscle aches pains calf pain back pain chest pain shortness of breath.  No past medical history on file. Past Surgical History:  Procedure Laterality Date  . ABDOMINAL HYSTERECTOMY    . APPENDECTOMY      Current Outpatient Medications:  .  cyanocobalamin (,VITAMIN B-12,) 1000 MCG/ML injection, Inject into the muscle., Disp: , Rfl:  .  amLODipine (NORVASC) 5 MG tablet, , Disp: , Rfl:  .  atorvastatin (LIPITOR) 40 MG tablet, , Disp: , Rfl:  .  cetirizine (ZYRTEC) 10 MG tablet, Take by mouth., Disp: , Rfl:  .  citalopram (CELEXA) 20 MG tablet, , Disp: , Rfl:  .  ferrous sulfate 325 (65 FE) MG tablet, Take by mouth., Disp: , Rfl:  .  fluticasone (FLONASE) 50 MCG/ACT nasal spray, USE TWO SPRAYS IN EACH NOSTRIL EVERY DAY, Disp: , Rfl:  .  omeprazole (PRILOSEC) 40 MG capsule, , Disp: , Rfl:  .  zolpidem (AMBIEN) 10 MG tablet, , Disp: , Rfl:   Allergies  Allergen Reactions  . Hydrocodone-Acetaminophen     Other reaction(s): Other (See Comments) questionable: "makes me feel funny"  . Levofloxacin   . Penicillins   . Sulfa Antibiotics    Review of Systems Objective:  There were no vitals filed for this visit.  General: Well developed, nourished, in no acute distress, alert and oriented x3   Dermatological: Skin is warm, dry and supple bilateral. Nails x 10 are well maintained; remaining integument appears  unremarkable at this time. There are no open sores, no preulcerative lesions, no rash or signs of infection present.  Vascular: Dorsalis Pedis artery and Posterior Tibial artery pedal pulses are 2/4 bilateral with immedate capillary fill time. Pedal hair growth present. No varicosities and no lower extremity edema present bilateral.   Neruologic: Grossly intact via light touch bilateral. Vibratory intact via tuning fork bilateral. Protective threshold with Semmes Wienstein monofilament intact to all pedal sites bilateral. Patellar and Achilles deep tendon reflexes 2+ bilateral. No Babinski or clonus noted bilateral.   Musculoskeletal: No gross boney pedal deformities bilateral. No pain, crepitus, or limitation noted with foot and ankle range of motion bilateral. Muscular strength 5/5 in all groups tested bilateral.  Pain on palpation of the fibula left.  There is overlying ecchymosis and mild edema.  Tenderness on palpation good range of motion of the ankle does not appear to be unstable.  Gait: Unassisted, Nonantalgic.    Radiographs:  Radiographs taken today demonstrate a spiral fracture of the distal fibula minimally displaced ankle mortise appears to be in good position.    Assessment & Plan:   Assessment: Fracture fibula distal left.    Plan: Placed her in a compression dressing she will remove in 2 weeks and continue the use of the cam walker.     Jennah Satchell T. [place], [place]

## 2018-11-13 ENCOUNTER — Telehealth: Payer: Self-pay | Admitting: Podiatry

## 2018-11-13 NOTE — Telephone Encounter (Signed)
I informed pt of Dr. Stephenie Acres orders.

## 2018-11-13 NOTE — Telephone Encounter (Signed)
I had put a pressure dressing on her leg and then the boot.  She can remove the pressure dressing and then put the boot back on.  She is to wear the boot like a cast.

## 2018-11-13 NOTE — Telephone Encounter (Signed)
Pt called and has a question about the boot she is wearing. She was told by you she would be able to remove a part of the boot in a couple of weeks and tomorrow will be that time. What part of the boot where you talking about that can be removed. Pt states it is bothering her a lot.

## 2018-11-14 ENCOUNTER — Ambulatory Visit: Payer: Medicare Other | Admitting: Podiatry

## 2018-11-15 ENCOUNTER — Telehealth: Payer: Self-pay | Admitting: Podiatry

## 2018-11-15 NOTE — Telephone Encounter (Signed)
Pt left message @ 426pm for me to call her back.  I returned call and left message for pt to call me back.

## 2018-11-28 ENCOUNTER — Ambulatory Visit (INDEPENDENT_AMBULATORY_CARE_PROVIDER_SITE_OTHER): Payer: Medicare Other | Admitting: Podiatry

## 2018-11-28 ENCOUNTER — Ambulatory Visit (INDEPENDENT_AMBULATORY_CARE_PROVIDER_SITE_OTHER): Payer: Medicare Other

## 2018-11-28 ENCOUNTER — Encounter: Payer: Self-pay | Admitting: Podiatry

## 2018-11-28 DIAGNOSIS — S8265XD Nondisplaced fracture of lateral malleolus of left fibula, subsequent encounter for closed fracture with routine healing: Secondary | ICD-10-CM

## 2018-11-28 NOTE — Progress Notes (Signed)
She presents today for follow-up of her fracture fibula left states that she it feels fine and she does not like wearing the boot.  Objective: Vital signs are stable she is alert and oriented x3.  Pulses are palpable.  She has edema to the left ankle and pain on palpation of the fibula distally.  Radiographs taken today demonstrate early signs of healing consistent with early fracture narrowing and what appears to be bone callus formation.  Assessment: Fracture fibula left.  Plan: Redressed compression anklet today and put her back in a cam walker.

## 2018-12-26 ENCOUNTER — Encounter: Payer: Self-pay | Admitting: Podiatry

## 2018-12-26 ENCOUNTER — Ambulatory Visit (INDEPENDENT_AMBULATORY_CARE_PROVIDER_SITE_OTHER): Payer: Medicare Other

## 2018-12-26 ENCOUNTER — Ambulatory Visit (INDEPENDENT_AMBULATORY_CARE_PROVIDER_SITE_OTHER): Payer: Medicare Other | Admitting: Podiatry

## 2018-12-26 DIAGNOSIS — S8265XD Nondisplaced fracture of lateral malleolus of left fibula, subsequent encounter for closed fracture with routine healing: Secondary | ICD-10-CM

## 2018-12-26 NOTE — Progress Notes (Signed)
She presents today for follow-up of her fractured ankle times about 8 to 10 weeks now she states that seems to be doing better.  She states that she is tired of wearing her big boot.  Objective: Vital signs are stable alert oriented x3.  Pulses are palpable.  There is no erythema edema cellulitis drainage or odor she has mild tenderness on palpation of the fibula.  Radiograph demonstrates a healing lateral malleolar fracture.  Assessment: Slowly healing fibular fracture.  Plan: Placed in a compression anklet and a Tri-Lock brace with tennis shoe follow-up with her in about 6 weeks for another set of x-rays.

## 2019-01-30 ENCOUNTER — Ambulatory Visit (INDEPENDENT_AMBULATORY_CARE_PROVIDER_SITE_OTHER): Payer: Medicare Other

## 2019-01-30 ENCOUNTER — Ambulatory Visit (INDEPENDENT_AMBULATORY_CARE_PROVIDER_SITE_OTHER): Payer: Medicare Other | Admitting: Podiatry

## 2019-01-30 ENCOUNTER — Encounter: Payer: Self-pay | Admitting: Podiatry

## 2019-01-30 DIAGNOSIS — S8265XD Nondisplaced fracture of lateral malleolus of left fibula, subsequent encounter for closed fracture with routine healing: Secondary | ICD-10-CM

## 2019-01-30 NOTE — Progress Notes (Signed)
She presents today for follow-up of her fractured distal fibula.  She says finally have not had any pain whatsoever continue to wear the back brace every once in a while around the house but most of the time I did slip on the shoes.  Objective: Vital signs are stable she alert oriented x3.  Pulses are palpable.  Radiographs demonstrate well healing fracture fibular malleolus at the level of the ankle.  The ankle joint and mortise appear to be normal.  Assessment: Well-healing fractured malleolus.  Plan: Get back to her regular routine follow-up with me on as-needed basis.

## 2019-05-29 ENCOUNTER — Other Ambulatory Visit: Payer: Self-pay | Admitting: Family Medicine

## 2019-05-29 DIAGNOSIS — R1900 Intra-abdominal and pelvic swelling, mass and lump, unspecified site: Secondary | ICD-10-CM

## 2019-06-06 ENCOUNTER — Ambulatory Visit: Payer: Medicare Other

## 2019-06-09 IMAGING — CT CT MAXILLOFACIAL W/O CM
3 series · 15 of 47 positions shown, 18 images · non-contrast
Comparison: Head CT 01/20/2017

CLINICAL DATA: Fell last evening and hit face.

EXAM:
CT MAXILLOFACIAL WITHOUT CONTRAST
TECHNIQUE: Multidetector CT imaging of the maxillofacial structures was
performed. Multiplanar CT image reconstructions were also generated.

[Series 2: max soft · axial · 0.31mm/px · z∈[-232,-106]mm · 9 of 75 slices shown, 12 images]
[im 6/75  brain]
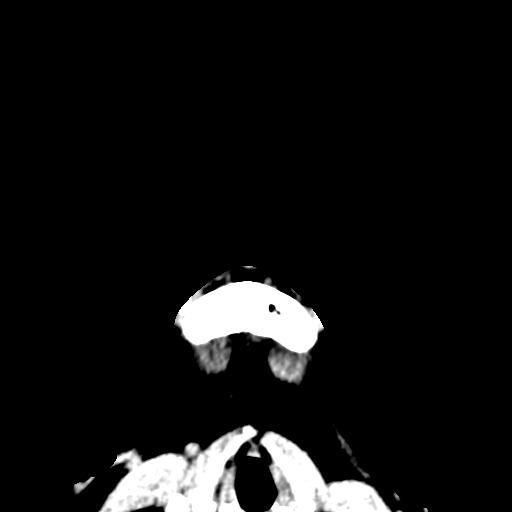
[im 6/75  bone]
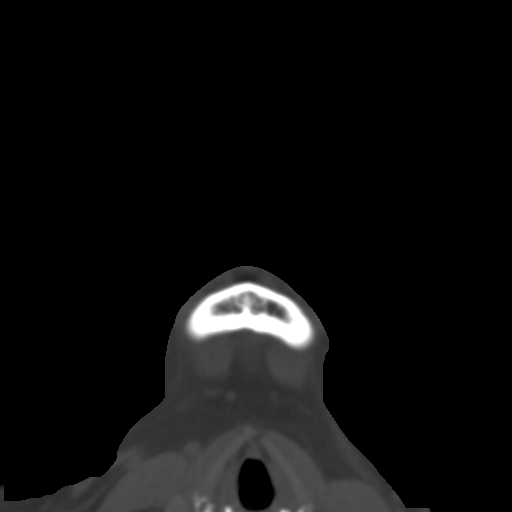
[im 13/75  bone]
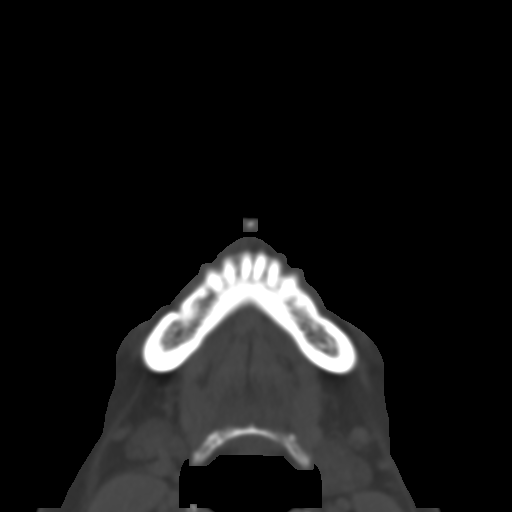
[im 21/75  bone]
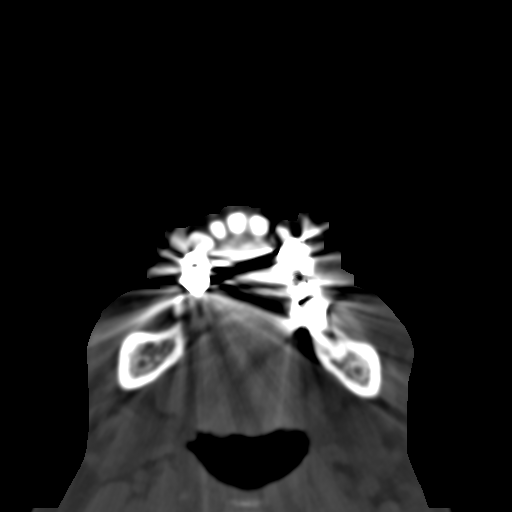
[im 29/75  bone]
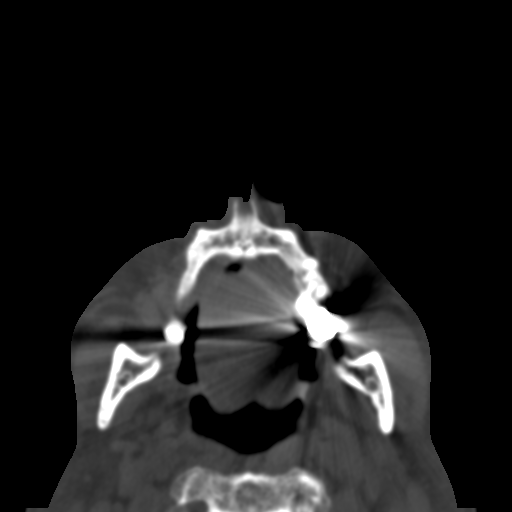
[im 39/75  brain]
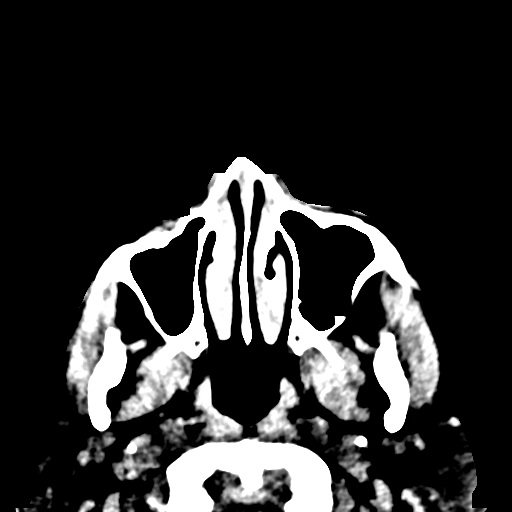
[im 39/75  bone]
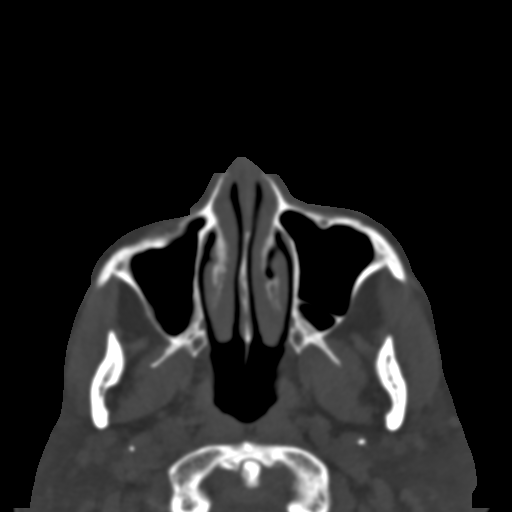
[im 46/75  bone]
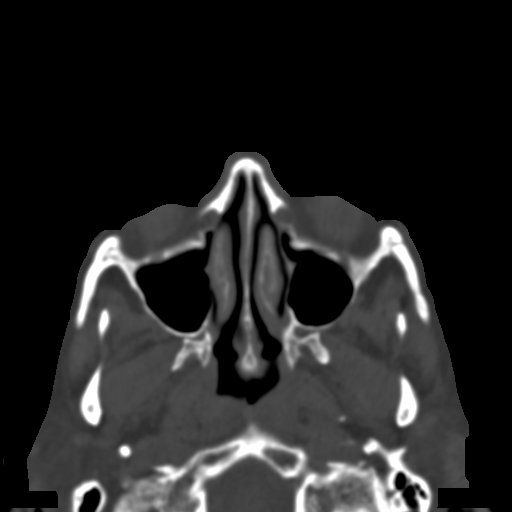
[im 54/75  bone]
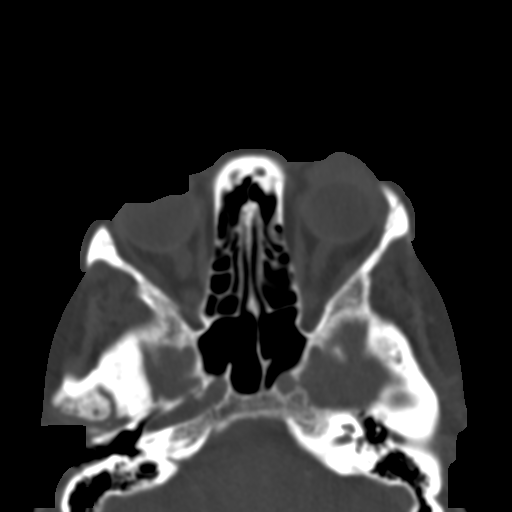
[im 62/75  bone]
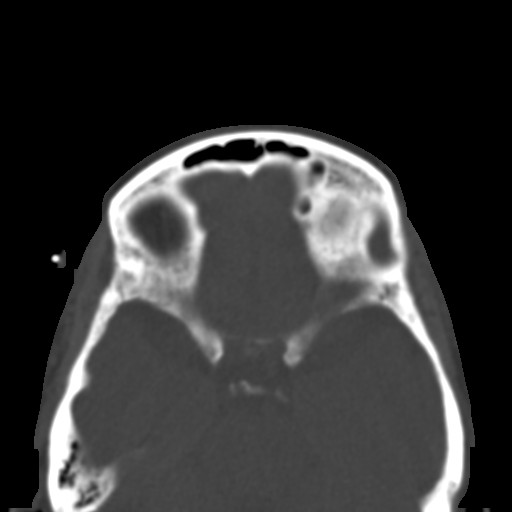
[im 69/75  brain]
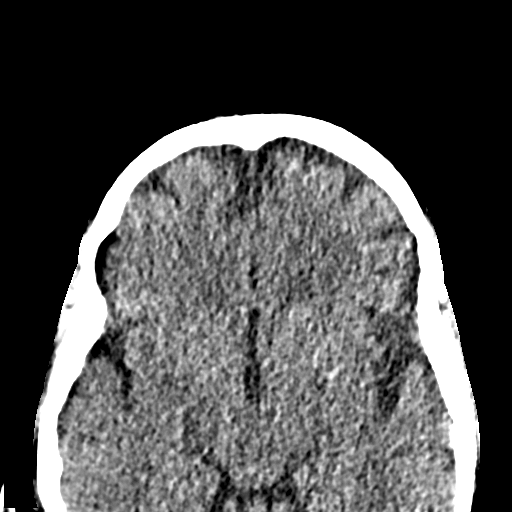
[im 69/75  bone]
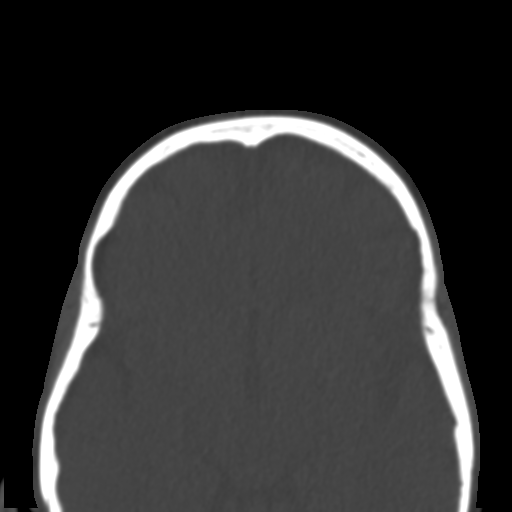

[Series 6: coronal soft · coronal · 0.31mm/px · 3 of 62 slices shown]
[im 21/62  bone]
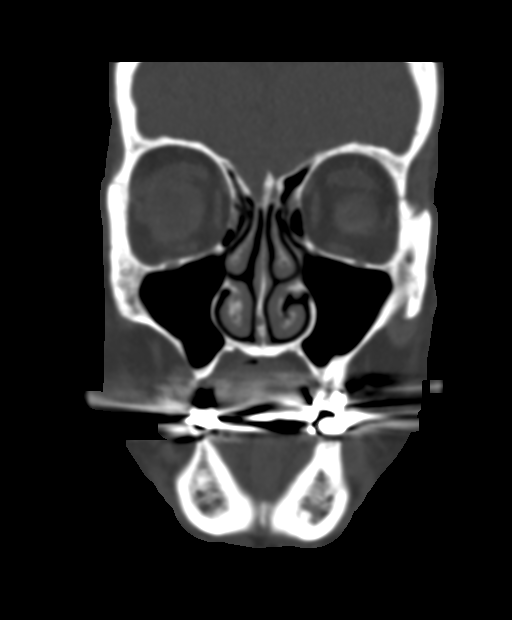
[im 28/62  bone]
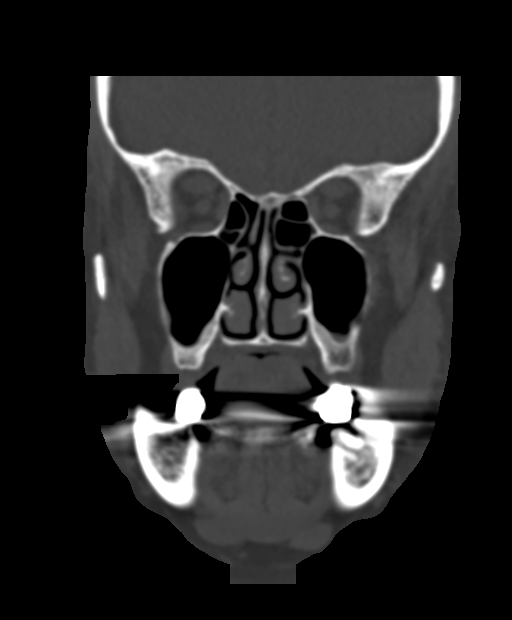
[im 34/62  bone]
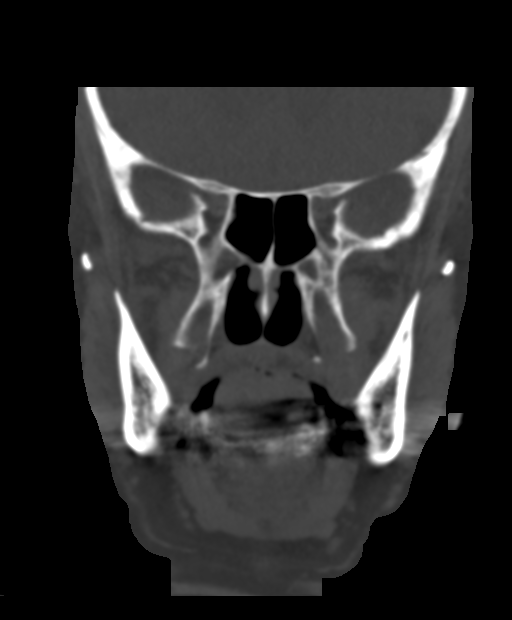

[Series 7: sagittal soft · sagittal · 0.25mm/px · 3 of 74 slices shown]
[im 25/74  bone]
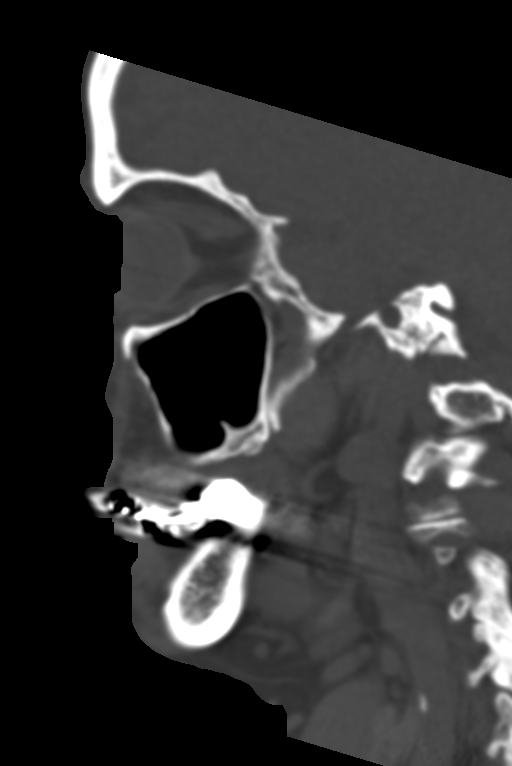
[im 37/74  bone]
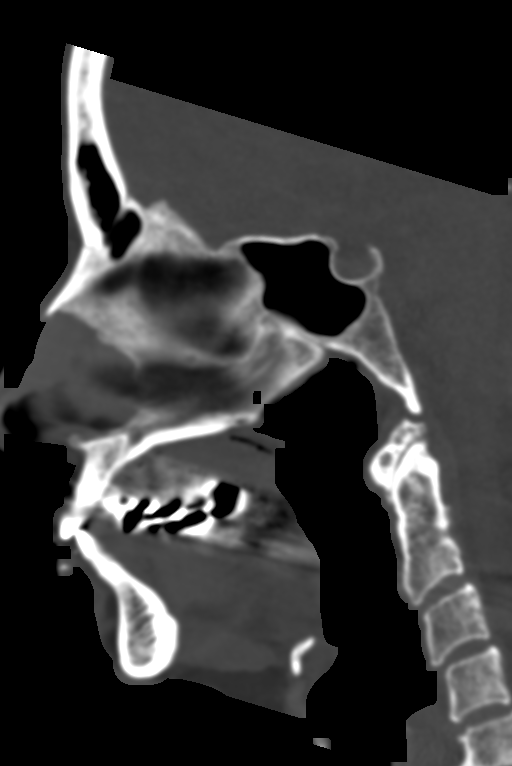
[im 49/74  bone]
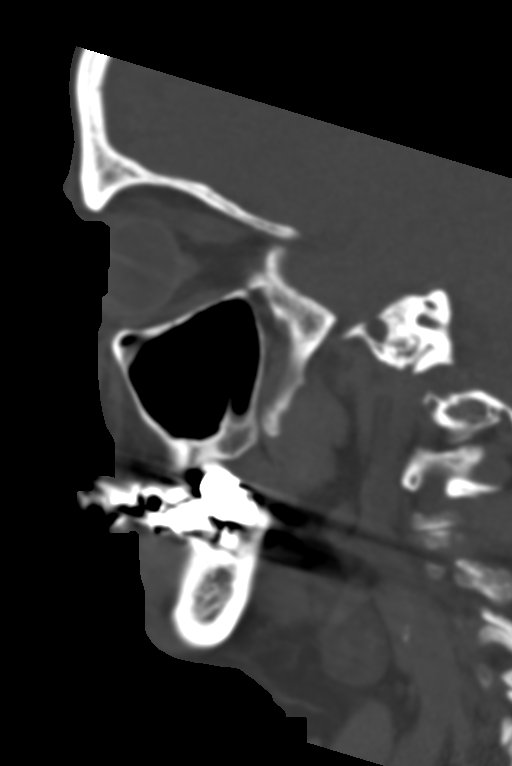

[15 of 47 positions shown; findings below may reference images not displayed]

FINDINGS: Osseous: No acute facial bone fractures are identified. The
mandibular condyles are normally located. Advanced degenerative
changes.

Orbits: The orbits are intact.  The globes are intact.

Sinuses: The paranasal sinuses and mastoid air cells are clear.

Soft tissues: Soft tissue swelling noted over the left orbital on
frontal area with laceration and air in the soft tissues.

Limited intracranial: No significant intracranial abnormalities are
identified. No extra-axial fluid collection.
IMPRESSION: No acute facial bone fractures.

## 2019-06-12 ENCOUNTER — Ambulatory Visit
Admission: RE | Admit: 2019-06-12 | Discharge: 2019-06-12 | Disposition: A | Discharge status: Home / Self Care | Payer: Medicare Other | Source: Ambulatory Visit | Attending: Family Medicine | Admitting: Family Medicine

## 2019-06-12 ENCOUNTER — Other Ambulatory Visit: Payer: Self-pay

## 2019-06-12 DIAGNOSIS — R1900 Intra-abdominal and pelvic swelling, mass and lump, unspecified site: Secondary | ICD-10-CM | POA: Diagnosis not present

## 2021-04-06 ENCOUNTER — Other Ambulatory Visit: Payer: Self-pay | Admitting: Nephrology

## 2021-04-06 DIAGNOSIS — N184 Chronic kidney disease, stage 4 (severe): Secondary | ICD-10-CM

## 2021-04-20 ENCOUNTER — Ambulatory Visit
Admission: RE | Admit: 2021-04-20 | Discharge: 2021-04-20 | Disposition: A | Discharge status: Home / Self Care | Payer: Medicare Other | Source: Ambulatory Visit | Attending: Nephrology | Admitting: Nephrology

## 2021-04-20 DIAGNOSIS — N184 Chronic kidney disease, stage 4 (severe): Secondary | ICD-10-CM

## 2021-06-21 ENCOUNTER — Telehealth: Payer: Self-pay | Admitting: *Deleted

## 2021-08-13 NOTE — Telephone Encounter (Signed)
Patient did not leave a message.

## 2022-01-27 ENCOUNTER — Other Ambulatory Visit: Payer: Self-pay | Admitting: Internal Medicine

## 2022-01-27 DIAGNOSIS — M5136 Other intervertebral disc degeneration, lumbar region: Secondary | ICD-10-CM

## 2022-02-14 ENCOUNTER — Ambulatory Visit
Admission: RE | Admit: 2022-02-14 | Discharge: 2022-02-14 | Disposition: A | Discharge status: Home / Self Care | Payer: Medicare Other | Source: Ambulatory Visit | Attending: Internal Medicine | Admitting: Internal Medicine

## 2022-02-14 ENCOUNTER — Other Ambulatory Visit: Payer: Self-pay

## 2022-02-14 DIAGNOSIS — M5136 Other intervertebral disc degeneration, lumbar region: Secondary | ICD-10-CM | POA: Diagnosis present

## 2022-03-15 ENCOUNTER — Other Ambulatory Visit: Payer: Self-pay | Admitting: Family Medicine

## 2022-03-15 DIAGNOSIS — R61 Generalized hyperhidrosis: Secondary | ICD-10-CM

## 2022-03-15 DIAGNOSIS — R1013 Epigastric pain: Secondary | ICD-10-CM

## 2022-03-15 DIAGNOSIS — R110 Nausea: Secondary | ICD-10-CM

## 2022-03-28 ENCOUNTER — Ambulatory Visit: Payer: Medicare Other

## 2022-06-22 ENCOUNTER — Other Ambulatory Visit: Payer: Self-pay | Admitting: Orthopedic Surgery

## 2022-06-22 DIAGNOSIS — M25562 Pain in left knee: Secondary | ICD-10-CM

## 2022-06-22 DIAGNOSIS — S83232D Complex tear of medial meniscus, current injury, left knee, subsequent encounter: Secondary | ICD-10-CM

## 2022-07-06 ENCOUNTER — Ambulatory Visit
Admission: RE | Admit: 2022-07-06 | Discharge: 2022-07-06 | Disposition: A | Discharge status: Home / Self Care | Payer: Medicare Other | Source: Ambulatory Visit | Attending: Orthopedic Surgery | Admitting: Orthopedic Surgery

## 2022-07-06 DIAGNOSIS — M25562 Pain in left knee: Secondary | ICD-10-CM | POA: Diagnosis present

## 2022-07-06 DIAGNOSIS — S83232D Complex tear of medial meniscus, current injury, left knee, subsequent encounter: Secondary | ICD-10-CM | POA: Insufficient documentation

## 2022-07-12 ENCOUNTER — Encounter: Payer: Self-pay | Admitting: Physical Therapy

## 2022-07-12 ENCOUNTER — Ambulatory Visit: Payer: Medicare Other | Attending: Internal Medicine | Admitting: Physical Therapy

## 2022-07-12 ENCOUNTER — Ambulatory Visit: Payer: Medicare Other | Admitting: Neurosurgery

## 2022-07-12 DIAGNOSIS — M5459 Other low back pain: Secondary | ICD-10-CM | POA: Diagnosis present

## 2022-07-12 NOTE — Therapy (Signed)
OUTPATIENT PHYSICAL THERAPY THORACOLUMBAR EVALUATION   Patient Name: Tamara Levine MRN: [id number] DOB:May 30, 1936, 86 y.o., female Today's Date: 07/12/2022    History reviewed. No pertinent past medical history. Past Surgical History:  Procedure Laterality Date   ABDOMINAL HYSTERECTOMY     APPENDECTOMY     Patient Active Problem List   Diagnosis Date Noted   Abnormal glucose 08/05/2016   CKD (chronic kidney disease) stage 3, GFR 30-59 ml/min (HCC) 02/04/2016   Moderate mitral insufficiency 05/07/2015   Benign essential hypertension 04/27/2015   Chest pain 01/06/2015   SOB (shortness of breath) 05/20/2014   Anemia 04/29/2014   Chronic insomnia 04/29/2014   GERD (gastroesophageal reflux disease) 04/29/2014   Mixed hyperlipidemia 04/29/2014    PCP: Fulton Reek MD  REFERRING PROVIDER: Fulton Reek MD  REFERRING DIAG: chronic R sided LBP   Rationale for Evaluation and Treatment Rehabilitation  THERAPY DIAG:  Other low back pain  ONSET DATE: "Years ago" worsening Feb 2023  SUBJECTIVE:                                                                                                                                                                                           SUBJECTIVE STATEMENT: Pt with chronic LBP, with exacerbation of R sided LBP with R buttock pain 6 months ago with insidious onset  PERTINENT HISTORY:  Pt is a 86 year old female presenting with acute on chronic LBP. Reports she does not recall a time her back did not hurt. She reports her back has started bothering her more in the past 6 months with insidious onset. Reports her LBP is R sided, in the low back and R buttock with radiating sore pain down the RLE . Denies any numbness/tingling. Reports the pain feels like a constant dull ache, but is made worse by standing >83mins, sitting without support, squatting, bending forward to reach low cabinet. Has tried heat, ice, icy hot to make it feel better,  they do not help. Has some relief with laying on her back. Worst and current pain 9/10; best 4/10. Pt is retired but enjoys doing house work, and yard work. She has had no falls, but feels unsteady when moving quickly. Does not use AD. Lives alone in a one story home (has a basement she does not need to go to) with level entry. Completes her own community errands. Does not have modifications to her bathroom. Pt denies N/V, B&B changes, unexplained weight fluctuation, saddle paresthesia, fever, night sweats, or unrelenting night pain at this time.   PAIN:  Are you having pain? Yes: NPRS scale: 9/10 Pain location: R sided LB and  buttock Pain description: dull ache Aggravating factors: standing >55mins, sitting without support, squatting, bending forward to reach low cabinet Relieving factors: laying on her back    PRECAUTIONS: Fall  WEIGHT BEARING RESTRICTIONS No  FALLS:  Has patient fallen in last 6 months? No  LIVING ENVIRONMENT: Lives with: lives alone Lives in: House/apartment Stairs: No Has following equipment at home: Single point cane and Environmental consultant - 4 wheeled  OCCUPATION: Retired  PLOF: Independent  PATIENT GOALS To be able to function ind without pain    OBJECTIVE:   DIAGNOSTIC FINDINGS:  MRI 02/15/22 IMPRESSION: Lumbar spondylosis, as outlined and with findings most notably as follows.   At L3-L4, there is mild disc degeneration. Grade 1 anterolisthesis. Disc uncovering with disc bulge. Advanced facet arthrosis with ligamentum flavum hypertrophy. Small right facet joint effusion. Resultant severe bilateral subarticular and central canal stenosis. Bilateral neural foraminal narrowing (mild right, moderate left).   At L4-L5, there is moderate disc degeneration. Grade 1 anterolisthesis. Disc uncovering with disc bulge. Advanced facet arthrosis with ligamentum flavum hypertrophy. Bilateral subarticular stenosis with potential to affect either descending L5 nerve  root. Mild to moderate narrowing of the central canal. Bilateral neural foraminal narrowing (mild right, moderate left).   At L5-S1, there is advanced disc degeneration. Disc bulge with endplate spurring. Superimposed small central disc protrusion. Facet arthrosis. Resultant bilateral neural foraminal narrowing (moderate right, moderate/severe left).   At L1-L2, there is moderate disc degeneration. Multifactorial moderate/severe bilateral neural foraminal narrowing. A disc protrusion contributes to neural foraminal narrowing on the right. Minimal bilateral subarticular narrowing also present at this level.   Lumbar levocurvature  PATIENT SURVEYS:  FOTO 47 Goal of: 56   SCREENING FOR RED FLAGS: Bowel or bladder incontinence: No Spinal tumors: No Cauda equina syndrome: No Compression fracture: No Abdominal aneurysm: No  COGNITION:  Overall cognitive status: Within functional limits for tasks assessed     SENSATION: WFL  MUSCLE LENGTH: Hamstrings: Right  Wnl deg; Left WNL deg Marcello Moores test: Right Shortened deg; Left shortened deg Elys shortened bilat  POSTURE: rounded shoulders, forward head, increased thoracic kyphosis, and flexed trunk lower crossed  PALPATION: Concordant pain to palpation over piriformis at glute max. Tension without pain to bilat lumbar paraspinals. Secondary pain with trigger points to superior glute max/glute med  LUMBAR ROM:   Active  A/PROM  eval  Flexion WNL  Extension 25% limited; reports "feels good"  Right lateral flexion   Left lateral flexion   Right rotation 25% limited  Left rotation 25% limited   (Blank rows = not tested)  Bilat knee and ankle ROM WNL  Bilat Hip ext P/AROM: R: 10/0 L20/10  Bilat hip IR 25% limited P/AROM bilat  Other hip motions WNL  LOWER EXTREMITY ROM:     MMT Right eval Left eval  Hip flexion 4+ 4+  Hip extension 3 3+  Hip abduction 4- 4  Hip adduction 4+ 4+  Hip internal rotation 4 with LBP 4  with LBP  Hip external rotation 4 4  Knee flexion 4+ 4+  Knee extension 4+ 4+  Ankle dorsiflexion 5 5  Ankle plantarflexion    Ankle inversion    Ankle eversion     (Blank rows = not tested)    LUMBAR SPECIAL TESTS:  Straight leg raise test: Negative, Slump test: Negative, Quadrant test: Negative, Single leg stance test: Negative, FABER test: Negative, and Thomas test: Positive bilat FADIR positive bilat Piriformis test L neg R positive  FUNCTIONAL TESTS:  5  times sit to stand: 16sec 10 meter walk test: self selected: 0.47m/s  fastest: 1.26m/s  TUG 19sec   GAIT: Distance walked: 26ft Assistive device utilized: None Level of assistance: Modified independence Comments: decreased R stance time/L step length    TODAY'S TREATMENT  PT reviewed the following HEP with patient with patient able to demonstrate a set of the following with min cuing for correction needed. PT educated patient on parameters of therex (how/when to inc/decrease intensity, frequency, rep/set range, stretch hold time, and purpose of therex) with verbalized understanding.   Access Code: [email or web address] - Modified Thomas Stretch  - 2-3 x daily - 7 x weekly - 30sec hold - Seated Piriformis Stretch  - 2-3 x daily - 7 x weekly - 30sec hold   PATIENT EDUCATION:  Education details: Patient was educated on diagnosis, anatomy and pathology involved, prognosis, role of PT, and was given an HEP, demonstrating exercise with proper form following verbal and tactile cues, and was given a paper hand out to continue exercise at home. Pt was educated on and agreed to plan of care. Person educated: Patient Education method: Consulting civil engineer, Demonstration, Verbal cues, and Handouts Education comprehension: verbalized understanding, returned demonstration, and verbal cues required   HOME EXERCISE PROGRAM: Access Code: [email or web address] - Modified Thomas Stretch  - 2-3 x daily - 7 x weekly - 30sec hold - Seated Piriformis Stretch  - 2-3 x  daily - 7 x weekly - 30sec hold  ASSESSMENT:  CLINICAL IMPRESSION: Patient is a 86  y.o. female who was seen today for physical therapy evaluation and treatment for R LBP and buttock pain. Impairments in tension and trigger points to glute musculature, decreased hip mobility, decreased hip and core strength, decreased community ambulation speed, abnormal gait, abnormal posture, and pain. Activity limitations in bending/squatting, standing, sitting, and lifting; inhibiti\ng full participation in self care and community ADLs. Would benefit from skilled PT to address above deficits and promote optimal return to PLOF.     OBJECTIVE IMPAIRMENTS Abnormal gait, decreased activity tolerance, decreased balance, decreased coordination, decreased endurance, decreased mobility, difficulty walking, decreased ROM, decreased strength, hypomobility, increased fascial restrictions, impaired flexibility, impaired tone, improper body mechanics, postural dysfunction, and pain.   ACTIVITY LIMITATIONS lifting, bending, standing, squatting, stairs, transfers, and locomotion level  PARTICIPATION LIMITATIONS: meal prep, cleaning, laundry, driving, shopping, community activity, and yard work  PERSONAL FACTORS Age, Fitness, Past/current experiences, Time since onset of injury/illness/exacerbation, and 3+ comorbidities: HLD, SOB, ANEMIA, OA  are also affecting patient's functional outcome.   REHAB POTENTIAL: Good  CLINICAL DECISION MAKING: Evolving/moderate complexity  EVALUATION COMPLEXITY: Moderate   GOALS: Goals reviewed with patient? Yes  SHORT TERM GOALS: Target date: 08/09/2022  Pt will be independent with HEP in order to improve strength and balance in order to decrease fall risk and improve function at home and work.  Baseline: 07/12/22 HEP given  Goal status: INITIAL    LONG TERM GOALS: Target date: 09/06/2022  Patient will increase FOTO score to 56 to demonstrate predicted increase in functional  mobility to complete ADLs  Baseline: 47 Goal status: INITIAL  2.  Pt will demonstrate TUG in 11sec or less in order to demonstrate age matched norm of decreased fall risk  Baseline: 19sec Goal status: INITIAL  3.  Pt will decrease worst pain as reported on NPRS by at least 3 points in order to demonstrate clinically significant reduction in pain.  Baseline: 07/12/22 9/10 Goal status: INITIAL  4.  Pt will increase 10MWT to at least  1.86m/s in order to demonstrate clinically significant decreased fall risk oc community ambulation.   Baseline: 07/12/22 0.75m/s Goal status: INITIAL  5.  Pt will decrease 5TSTS by at least 3 seconds in order to demonstrate clinically significant improvement in LE strength  Baseline: 07/12/22 16 Goal status: INITIAL     PLAN: PT FREQUENCY: 1-2x/week  PT DURATION: 8 weeks  PLANNED INTERVENTIONS: Therapeutic exercises, Therapeutic activity, Neuromuscular re-education, Balance training, Gait training, Patient/Family education, Self Care, Joint mobilization, Joint manipulation, Stair training, Aquatic Therapy, Dry Needling, Electrical stimulation, Spinal manipulation, Spinal mobilization, Cryotherapy, Moist heat, Taping, Traction, Ultrasound, Ionotophoresis 4mg /ml Dexamethasone, Manual therapy, and Re-evaluation.  PLAN FOR NEXT SESSION: DGI, HEP review   Durwin Reges DPT Durwin Reges, PT 07/12/2022, 3:09 PM

## 2022-07-14 ENCOUNTER — Ambulatory Visit (INDEPENDENT_AMBULATORY_CARE_PROVIDER_SITE_OTHER): Payer: Medicare Other | Admitting: Neurosurgery

## 2022-07-14 ENCOUNTER — Ambulatory Visit: Payer: Medicare Other | Admitting: Physical Therapy

## 2022-07-14 ENCOUNTER — Encounter: Payer: Self-pay | Admitting: Neurosurgery

## 2022-07-14 ENCOUNTER — Encounter: Payer: Self-pay | Admitting: Physical Therapy

## 2022-07-14 VITALS — BP 142/74 | HR 68 | Ht 64.0 in | Wt 136.8 lb

## 2022-07-14 DIAGNOSIS — M5459 Other low back pain: Secondary | ICD-10-CM

## 2022-07-14 DIAGNOSIS — M48062 Spinal stenosis, lumbar region with neurogenic claudication: Secondary | ICD-10-CM | POA: Diagnosis not present

## 2022-07-14 DIAGNOSIS — M5416 Radiculopathy, lumbar region: Secondary | ICD-10-CM | POA: Diagnosis not present

## 2022-07-14 NOTE — Therapy (Signed)
OUTPATIENT PHYSICAL THERAPY TREATMENT NOTE   Patient Name: Tamara Levine MRN: [id number] DOB:28-Apr-1936, 86 y.o., female Today's Date: 07/14/2022  PCP: Fulton Reek MD REFERRING PROVIDER: Fulton Reek MD  END OF SESSION:   PT End of Session - 07/14/22 0924     Visit Number 2    Number of Visits 17    Date for PT Re-Evaluation 09/09/22    Authorization - Visit Number 2    Authorization - Number of Visits 10    Progress Note Due on Visit 10    PT Start Time 0921    PT Stop Time 1000    PT Time Calculation (min) 39 min    Activity Tolerance Patient tolerated treatment well    Behavior During Therapy [facility] for tasks assessed/performed             History reviewed. No pertinent past medical history. Past Surgical History:  Procedure Laterality Date   ABDOMINAL HYSTERECTOMY     APPENDECTOMY     Patient Active Problem List   Diagnosis Date Noted   Abnormal glucose 08/05/2016   CKD (chronic kidney disease) stage 3, GFR 30-59 ml/min (HCC) 02/04/2016   Moderate mitral insufficiency 05/07/2015   Benign essential hypertension 04/27/2015   Chest pain 01/06/2015   SOB (shortness of breath) 05/20/2014   Anemia 04/29/2014   Chronic insomnia 04/29/2014   GERD (gastroesophageal reflux disease) 04/29/2014   Mixed hyperlipidemia 04/29/2014    REFERRING DIAG: LBP  THERAPY DIAG:  Other low back pain  Rationale for Evaluation and Treatment Rehabilitation  PERTINENT HISTORY: Pt is a 86 year old female presenting with acute on chronic LBP. Reports she does not recall a time her back did not hurt. She reports her back has started bothering her more in the past 6 months with insidious onset. Reports her LBP is R sided, in the low back and R buttock with radiating sore pain down the RLE . Denies any numbness/tingling. Reports the pain feels like a constant dull ache, but is made worse by standing >37mins, sitting without support, squatting, bending forward to reach low cabinet. Has  tried heat, ice, icy hot to make it feel better, they do not help. Has some relief with laying on her back. Worst and current pain 9/10; best 4/10. Pt is retired but enjoys doing house work, and yard work. She has had no falls, but feels unsteady when moving quickly. Does not use AD. Lives alone in a one story home (has a basement she does not need to go to) with level entry. Completes her own community errands. Does not have modifications to her bathroom. Pt denies N/V, B&B changes, unexplained weight fluctuation, saddle paresthesia, fever, night sweats, or unrelenting night pain at this time.  PRECAUTIONS: none  SUBJECTIVE: Pt reports she had an injection in her L knee yesterday, would like to work on this in therapy too. Compliance with HEP without question or concern. Reports 8/10 pain today.   PAIN:  Are you having pain? Yes: NPRS scale: 8/10 Pain location: R hip/buttock   OBJECTIVE: (objective measures completed at initial evaluation unless otherwise dated)   PATIENT SURVEYS:  FOTO 47 Goal of: 56    SCREENING FOR RED FLAGS: Bowel or bladder incontinence: No Spinal tumors: No Cauda equina syndrome: No Compression fracture: No Abdominal aneurysm: No   COGNITION:           Overall cognitive status: Within functional limits for tasks assessed  SENSATION: WFL   MUSCLE LENGTH: Hamstrings: Right    Wnl deg; Left WNL deg Marcello Moores test: Right Shortened deg; Left shortened deg Elys shortened bilat   POSTURE: rounded shoulders, forward head, increased thoracic kyphosis, and flexed trunk lower crossed   PALPATION: Concordant pain to palpation over piriformis at glute max. Tension without pain to bilat lumbar paraspinals. Secondary pain with trigger points to superior glute max/glute med   LUMBAR ROM:    Active  A/PROM  eval  Flexion WNL  Extension 25% limited; reports "feels good"  Right lateral flexion    Left lateral flexion    Right rotation 25%  limited  Left rotation 25% limited   (Blank rows = not tested)   Bilat knee and ankle ROM WNL   Bilat Hip ext P/AROM: R: 10/0 L20/10   Bilat hip IR 25% limited P/AROM bilat   Other hip motions WNL   LOWER EXTREMITY ROM:      MMT Right eval Left eval  Hip flexion 4+ 4+  Hip extension 3 3+  Hip abduction 4- 4  Hip adduction 4+ 4+  Hip internal rotation 4 with LBP 4 with LBP  Hip external rotation 4 4  Knee flexion 4+ 4+  Knee extension 4+ 4+  Ankle dorsiflexion 5 5  Ankle plantarflexion      Ankle inversion      Ankle eversion       (Blank rows = not tested)       LUMBAR SPECIAL TESTS:  Straight leg raise test: Negative, Slump test: Negative, Quadrant test: Negative, Single leg stance test: Negative, FABER test: Negative, and Thomas test: Positive bilat FADIR positive bilat Piriformis test L neg R positive   FUNCTIONAL TESTS:  5 times sit to stand: 16sec 10 meter walk test: self selected: 0.74m/s  fastest: 1.49m/s  TUG 19sec    GAIT: Distance walked: 38ft Assistive device utilized: None Level of assistance: Modified independence Comments: decreased R stance time/L step length    TODAY'S TREATMENT 07/14/22 Nustep seat 7 UE 12 L2 47mins with cuing to maintain SPM >60 for gentle rotation and stretching Lower trunk rotation x16 with good carry over of initial cuing of lumbopelvic dissociation  SKTC in hooklying x10sec; with CLLE ext 2x 10sec bilat Post pelvic tilt x12 with excellent carry over of technique Bridge 2x 10 with much better height in second set with good carry over of demo Squat to elevated mat table 2x 10  SLS R: 10sec L: 4sec  Seated piriformis stretch 30sec Seated childs pose stretch 30sec   TODAY'S TREATMENT 07/12/22 PT reviewed the following HEP with patient with patient able to demonstrate a set of the following with min cuing for correction needed. PT educated patient on parameters of therex (how/when to inc/decrease intensity, frequency,  rep/set range, stretch hold time, and purpose of therex) with verbalized understanding.    Access Code: [email or web address] - Modified Thomas Stretch  - 2-3 x daily - 7 x weekly - 30sec hold - Seated Piriformis Stretch  - 2-3 x daily - 7 x weekly - 30sec hold     PATIENT EDUCATION:  Education details: Patient was educated on diagnosis, anatomy and pathology involved, prognosis, role of PT, and was given an HEP, demonstrating exercise with proper form following verbal and tactile cues, and was given a paper hand out to continue exercise at home. Pt was educated on and agreed to plan of care. Person educated: Patient Education method: Explanation, Demonstration, Verbal cues, and Handouts Education comprehension:  verbalized understanding, returned demonstration, and verbal cues required     HOME EXERCISE PROGRAM: Access Code: [email or web address] - Modified Thomas Stretch  - 2-3 x daily - 7 x weekly - 30sec hold - Seated Piriformis Stretch  - 2-3 x daily - 7 x weekly - 30sec hold   ASSESSMENT:   CLINICAL IMPRESSION: PT initiated therex progression for increased glute max/extensor activation and strengthening, with lumbopelvic dissociation and deep rotator lengthening with success. Pt is able to comply with all cuing for proper technique with good motivation throughout session. Following session patient reports no pain (from original 8/10). PT will continue progression as able.       OBJECTIVE IMPAIRMENTS Abnormal gait, decreased activity tolerance, decreased balance, decreased coordination, decreased endurance, decreased mobility, difficulty walking, decreased ROM, decreased strength, hypomobility, increased fascial restrictions, impaired flexibility, impaired tone, improper body mechanics, postural dysfunction, and pain.    ACTIVITY LIMITATIONS lifting, bending, standing, squatting, stairs, transfers, and locomotion level   PARTICIPATION LIMITATIONS: meal prep, cleaning, laundry, driving, shopping, community  activity, and yard work   PERSONAL FACTORS Age, Fitness, Past/current experiences, Time since onset of injury/illness/exacerbation, and 3+ comorbidities: HLD, SOB, ANEMIA, OA  are also affecting patient's functional outcome.    REHAB POTENTIAL: Good   CLINICAL DECISION MAKING: Evolving/moderate complexity   EVALUATION COMPLEXITY: Moderate     GOALS: Goals reviewed with patient? Yes   SHORT TERM GOALS: Target date: 08/09/2022   Pt will be independent with HEP in order to improve strength and balance in order to decrease fall risk and improve function at home and work.   Baseline: 07/12/22 HEP given  Goal status: INITIAL       LONG TERM GOALS: Target date: 09/06/2022   Patient will increase FOTO score to 56 to demonstrate predicted increase in functional mobility to complete ADLs   Baseline: 47 Goal status: INITIAL   2.  Pt will demonstrate TUG in 11sec or less in order to demonstrate age matched norm of decreased fall risk  Baseline: 19sec Goal status: INITIAL   3.  Pt will decrease worst pain as reported on NPRS by at least 3 points in order to demonstrate clinically significant reduction in pain.   Baseline: 07/12/22 9/10 Goal status: INITIAL   4.  Pt will increase 10MWT to at least 1.68m/s in order to demonstrate clinically significant decreased fall risk oc community ambulation.    Baseline: 07/12/22 0.13m/s Goal status: INITIAL   5.  Pt will decrease 5TSTS by at least 3 seconds in order to demonstrate clinically significant improvement in LE strength   Baseline: 07/12/22 16 Goal status: INITIAL         PLAN: PT FREQUENCY: 1-2x/week   PT DURATION: 8 weeks   PLANNED INTERVENTIONS: Therapeutic exercises, Therapeutic activity, Neuromuscular re-education, Balance training, Gait training, Patient/Family education, Self Care, Joint mobilization, Joint manipulation, Stair training, Aquatic Therapy, Dry Needling, Electrical stimulation, Spinal manipulation, Spinal  mobilization, Cryotherapy, Moist heat, Taping, Traction, Ultrasound, Ionotophoresis 4mg /ml Dexamethasone, Manual therapy, and Re-evaluation.   PLAN FOR NEXT SESSION: DGI, HEP review      Durwin Reges, PT 07/14/2022, 10:13 AM

## 2022-07-14 NOTE — Progress Notes (Signed)
Follow-up note: Referring Physician:  Idelle Crouch, MD [facility] [place],  [place] [id number]  Primary Physician:  Tamara Crouch, MD  Chief Complaint:  f/u after PT   History of Present Illness: Tamara Levine is a 86 y.o. female who presents today for follow up regarding her lumbar spine after PT.  Today she reports near complete resolution of her leg pain. She does continue to have low back pain but states this is better than it was at her last visit. She has undergone a few session of PT and feels this is helping. She is still not interested in surgical intervention.   LOV 05/17/22 05/17/2022 Tamara Levine is a 86 y.o with a history of CKD, HTN, GERD, who is here today for further valuation of lumbosacral disease. She states that she has had some issues with her back and legs for many years however is worsened over the last 2 to 3 years. She has seen a chiropractor without any significant relief. She describes the pain as an aching in her low back that feels like arthritic pain that worsens with ambulating for more than 10 or 15 minutes. She states that the pain then radiates into her buttock and bilateral thighs and into her left knee. Her left leg is more severe than her right. It is worse with standing, walking, and cooking and improves with sitting. She describes an episode in April of severe neuropathy in her feet which was worse with laying flat. This is since resolved however she does continue to have some numbness in her feet. She has not undergone any conservative management and has not interested in surgical intervention at this time due to her age and underlying health conditions. In addition to the symptoms she describes left shoulder pain that is pinpoint and worse with reaching behind her back. This has been going on for several months. She denies any associated neck pain or pain that radiates down the length of her arm.  Review of Systems:   A 10 point review of systems is negative, and the pertinent positives and negatives detailed in the HPI.  Past Medical History: No past medical history on file.  Past Surgical History: Past Surgical History:  Procedure Laterality Date   ABDOMINAL HYSTERECTOMY     APPENDECTOMY      Allergies: Allergies as of 07/14/2022 - Review Complete 07/14/2022  Allergen Reaction Noted   Hydrocodone-acetaminophen  04/25/2014   Levofloxacin  01/20/2017   Penicillins  01/20/2017   Sulfa antibiotics  01/20/2017    Medications: Outpatient Encounter Medications as of 07/14/2022  Medication Sig   amLODipine (NORVASC) 5 MG tablet    atorvastatin (LIPITOR) 80 MG tablet Take 80 mg by mouth daily.   cetirizine (ZYRTEC) 10 MG tablet Take by mouth.   citalopram (CELEXA) 20 MG tablet    clonazePAM (KLONOPIN) 0.5 MG tablet    cyanocobalamin (,VITAMIN B-12,) 1000 MCG/ML injection Inject into the muscle.   ferrous sulfate 325 (65 FE) MG tablet Take by mouth.   fluticasone (FLONASE) 50 MCG/ACT nasal spray USE TWO SPRAYS IN EACH NOSTRIL EVERY DAY   omeprazole (PRILOSEC) 40 MG capsule    sodium bicarbonate 650 MG tablet Take 650 mg by mouth 2 (two) times daily.   zolpidem (AMBIEN) 10 MG tablet    No facility-administered encounter medications on file as of 07/14/2022.    Social History: Social History   Tobacco Use   Smoking status: Former  Types: Cigarettes    Quit date: 12/12/1990    Years since quitting: 31.6   Smokeless tobacco: Never  Substance Use Topics   Alcohol use: Yes    Comment: 2-3 drinks   Drug use: No    Family Medical History: No family history on file.  Exam:  Today's Vitals   07/14/22 1449  BP: (!) 142/74  Pulse: 68  Weight: 62.1 kg  Height: 5\' 4"  (1.626 m)  PainSc: 6   PainLoc: Back   Body mass index is 23.48 kg/m.   General: A&O.  ROM of spine: WNL.  Palpation of spine: non TTP.  Strength in the left lower extremity is EHL 5/5, Dorsiflexion 5/5, Plantar flexion  5/5, Hamstring 5/5, Quadricep 5/5, Iliopsoas 5/5. Strength in the right lower extremity is EHL 5/5, Dorsiflexion 5/5, Plantar flexion 5/5, Hamstring 5/5, Quadricep 5/5, Iliopsoas 5/5. Reflexes are 2+ and symmetric at the patella and achilles.   Bilateral lower extremity sensation is intact to light touch.  Toes down-going.  Gait is normal.  Imaging: MRI L spine 02/14/22  IMPRESSION:  Lumbar spondylosis, as outlined and with findings most notably as  follows.   At L3-L4, there is mild disc degeneration. Grade 1 anterolisthesis.  Disc uncovering with disc bulge. Advanced facet arthrosis with  ligamentum flavum hypertrophy. Small right facet joint effusion.  Resultant severe bilateral subarticular and central canal stenosis.  Bilateral neural foraminal narrowing (mild right, moderate left).   At L4-L5, there is moderate disc degeneration. Grade 1  anterolisthesis. Disc uncovering with disc bulge. Advanced facet  arthrosis with ligamentum flavum hypertrophy. Bilateral subarticular  stenosis with potential to affect either descending L5 nerve root.  Mild to moderate narrowing of the central canal. Bilateral neural  foraminal narrowing (mild right, moderate left).   At L5-S1, there is advanced disc degeneration. Disc bulge with  endplate spurring. Superimposed small central disc protrusion. Facet  arthrosis. Resultant bilateral neural foraminal narrowing (moderate  right, moderate/severe left).   At L1-L2, there is moderate disc degeneration. Multifactorial  moderate/severe bilateral neural foraminal narrowing. A disc  protrusion contributes to neural foraminal narrowing on the right.  Minimal bilateral subarticular narrowing also present at this level.   Lumbar levocurvature.   Electronically Signed  By: Tamara Levine D.O.  On: 02/15/2022 12:05    I have personally reviewed the images and agree with the above interpretation.  Assessment and Plan: Tamara Levine is a pleasant 86 y.o. female  with lumbosacral disease. She has had improvement with time and PT. She would like to undergo additional PT. If she does not improve she will let us know and we will refer her for injections. I will otherwise see her going forward on an as needed basis. She expressed understanding and was in agreement with this plan.   I spent a total of 21 minutes in both face-to-face and non-face-to-face activities for this visit on the date of this encounter including review of records, MRI review, review of symptoms, exam, discussion of plan of care and documentation.  Cooper Render PA-C Neurosurgery

## 2022-07-19 ENCOUNTER — Ambulatory Visit: Payer: Medicare Other | Admitting: Physical Therapy

## 2022-07-19 ENCOUNTER — Encounter: Payer: Self-pay | Admitting: Physical Therapy

## 2022-07-19 DIAGNOSIS — M5459 Other low back pain: Secondary | ICD-10-CM

## 2022-07-19 NOTE — Therapy (Signed)
OUTPATIENT PHYSICAL THERAPY TREATMENT NOTE   Patient Name: Tamara Levine MRN: [id number] DOB:01-29-1936, 86 y.o., female Today's Date: 07/19/2022  PCP: Fulton Reek MD REFERRING PROVIDER: Fulton Reek MD  END OF SESSION:   PT End of Session - 07/19/22 1338     Visit Number 3    Number of Visits 17    Date for PT Re-Evaluation 09/09/22    Authorization - Visit Number 3    Authorization - Number of Visits 10    Progress Note Due on Visit 10    PT Start Time 1301    PT Stop Time 1340    PT Time Calculation (min) 39 min    Activity Tolerance Patient tolerated treatment well    Behavior During Therapy [facility] for tasks assessed/performed              History reviewed. No pertinent past medical history. Past Surgical History:  Procedure Laterality Date   ABDOMINAL HYSTERECTOMY     APPENDECTOMY     Patient Active Problem List   Diagnosis Date Noted   Abnormal glucose 08/05/2016   CKD (chronic kidney disease) stage 3, GFR 30-59 ml/min (HCC) 02/04/2016   Moderate mitral insufficiency 05/07/2015   Benign essential hypertension 04/27/2015   Chest pain 01/06/2015   SOB (shortness of breath) 05/20/2014   Anemia 04/29/2014   Chronic insomnia 04/29/2014   GERD (gastroesophageal reflux disease) 04/29/2014   Mixed hyperlipidemia 04/29/2014    REFERRING DIAG: LBP  THERAPY DIAG:  Other low back pain  Rationale for Evaluation and Treatment Rehabilitation  PERTINENT HISTORY: Pt is a 86 year old female presenting with acute on chronic LBP. Reports she does not recall a time her back did not hurt. She reports her back has started bothering her more in the past 6 months with insidious onset. Reports her LBP is R sided, in the low back and R buttock with radiating sore pain down the RLE . Denies any numbness/tingling. Reports the pain feels like a constant dull ache, but is made worse by standing >32mins, sitting without support, squatting, bending forward to reach low cabinet. Has  tried heat, ice, icy hot to make it feel better, they do not help. Has some relief with laying on her back. Worst and current pain 9/10; best 4/10. Pt is retired but enjoys doing house work, and yard work. She has had no falls, but feels unsteady when moving quickly. Does not use AD. Lives alone in a one story home (has a basement she does not need to go to) with level entry. Completes her own community errands. Does not have modifications to her bathroom. Pt denies N/V, B&B changes, unexplained weight fluctuation, saddle paresthesia, fever, night sweats, or unrelenting night pain at this time.  PRECAUTIONS: none  SUBJECTIVE: Pt reports she was a little sore following last session. Reports very minimal pain currently. She reports she has less pain holding her great granddaughter today. Completing HEP stretching and attempting SLS with success.   PAIN:  Are you having pain? Yes: NPRS scale: 5/10 Pain location: R hip/buttock   OBJECTIVE: (objective measures completed at initial evaluation unless otherwise dated)   PATIENT SURVEYS:  FOTO 47 Goal of: 56    SCREENING FOR RED FLAGS: Bowel or bladder incontinence: No Spinal tumors: No Cauda equina syndrome: No Compression fracture: No Abdominal aneurysm: No   COGNITION:           Overall cognitive status: Within functional limits for tasks assessed  SENSATION: WFL   MUSCLE LENGTH: Hamstrings: Right    Wnl deg; Left WNL deg Marcello Moores test: Right Shortened deg; Left shortened deg Elys shortened bilat   POSTURE: rounded shoulders, forward head, increased thoracic kyphosis, and flexed trunk lower crossed   PALPATION: Concordant pain to palpation over piriformis at glute max. Tension without pain to bilat lumbar paraspinals. Secondary pain with trigger points to superior glute max/glute med   LUMBAR ROM:    Active  A/PROM  eval  Flexion WNL  Extension 25% limited; reports "feels good"  Right lateral flexion     Left lateral flexion    Right rotation 25% limited  Left rotation 25% limited   (Blank rows = not tested)   Bilat knee and ankle ROM WNL   Bilat Hip ext P/AROM: R: 10/0 L20/10   Bilat hip IR 25% limited P/AROM bilat   Other hip motions WNL   LOWER EXTREMITY ROM:      MMT Right eval Left eval  Hip flexion 4+ 4+  Hip extension 3 3+  Hip abduction 4- 4  Hip adduction 4+ 4+  Hip internal rotation 4 with LBP 4 with LBP  Hip external rotation 4 4  Knee flexion 4+ 4+  Knee extension 4+ 4+  Ankle dorsiflexion 5 5  Ankle plantarflexion      Ankle inversion      Ankle eversion       (Blank rows = not tested)       LUMBAR SPECIAL TESTS:  Straight leg raise test: Negative, Slump test: Negative, Quadrant test: Negative, Single leg stance test: Negative, FABER test: Negative, and Thomas test: Positive bilat FADIR positive bilat Piriformis test L neg R positive   FUNCTIONAL TESTS:  5 times sit to stand: 16sec 10 meter walk test: self selected: 0.7m/s  fastest: 1.66m/s  TUG 19sec    GAIT: Distance walked: 34ft Assistive device utilized: None Level of assistance: Modified independence Comments: decreased R stance time/L step length    TODAY'S TREATMENT 07/19/22 Nustep seat 7 UE 12 L2 53mins with cuing to maintain SPM >60 for gentle rotation and stretching TA activation x12 in hooklying with difficulty needing max cuing and demo prior TA alt toe taps 2x 12 with cuing throughout for technique with good carry over Prone alt hip ext 2x 12 with cuing for being able to "put a piece of paper under her belly button" with good carry over Birddog 2x 8 with min TC for stability occasionally Squat to elevated mat table 2x 10  Seated piriformis stretch 30sec Seated childs pose stretch 30sec    PATIENT EDUCATION:  Education details: Patient was educated on diagnosis, anatomy and pathology involved, prognosis, role of PT, and was given an HEP, demonstrating exercise with proper form  following verbal and tactile cues, and was given a paper hand out to continue exercise at home. Pt was educated on and agreed to plan of care. Person educated: Patient Education method: Consulting civil engineer, Demonstration, Verbal cues, and Handouts Education comprehension: verbalized understanding, returned demonstration, and verbal cues required     HOME EXERCISE PROGRAM: Access Code: [email or web address] - Modified Thomas Stretch  - 2-3 x daily - 7 x weekly - 30sec hold - Seated Piriformis Stretch  - 2-3 x daily - 7 x weekly - 30sec hold   ASSESSMENT:   CLINICAL IMPRESSION: PT initiated therex progression for increased glute max/extensor activation and strengthening, with lumbopelvic dissociation and deep rotator lengthening with success. Pt is able to comply with all cuing for  proper technique with good motivation throughout session. Following session patient reports no pain (from original 8/10). PT will continue progression as able.       OBJECTIVE IMPAIRMENTS Abnormal gait, decreased activity tolerance, decreased balance, decreased coordination, decreased endurance, decreased mobility, difficulty walking, decreased ROM, decreased strength, hypomobility, increased fascial restrictions, impaired flexibility, impaired tone, improper body mechanics, postural dysfunction, and pain.    ACTIVITY LIMITATIONS lifting, bending, standing, squatting, stairs, transfers, and locomotion level   PARTICIPATION LIMITATIONS: meal prep, cleaning, laundry, driving, shopping, community activity, and yard work   PERSONAL FACTORS Age, Fitness, Past/current experiences, Time since onset of injury/illness/exacerbation, and 3+ comorbidities: HLD, SOB, ANEMIA, OA  are also affecting patient's functional outcome.    REHAB POTENTIAL: Good   CLINICAL DECISION MAKING: Evolving/moderate complexity   EVALUATION COMPLEXITY: Moderate     GOALS: Goals reviewed with patient? Yes   SHORT TERM GOALS: Target date: 08/09/2022   Pt will  be independent with HEP in order to improve strength and balance in order to decrease fall risk and improve function at home and work.   Baseline: 07/12/22 HEP given  Goal status: INITIAL       LONG TERM GOALS: Target date: 09/06/2022   Patient will increase FOTO score to 56 to demonstrate predicted increase in functional mobility to complete ADLs   Baseline: 47 Goal status: INITIAL   2.  Pt will demonstrate TUG in 11sec or less in order to demonstrate age matched norm of decreased fall risk  Baseline: 19sec Goal status: INITIAL   3.  Pt will decrease worst pain as reported on NPRS by at least 3 points in order to demonstrate clinically significant reduction in pain.   Baseline: 07/12/22 9/10 Goal status: INITIAL   4.  Pt will increase 10MWT to at least 1.23m/s in order to demonstrate clinically significant decreased fall risk oc community ambulation.    Baseline: 07/12/22 0.51m/s Goal status: INITIAL   5.  Pt will decrease 5TSTS by at least 3 seconds in order to demonstrate clinically significant improvement in LE strength   Baseline: 07/12/22 16 Goal status: INITIAL         PLAN: PT FREQUENCY: 1-2x/week   PT DURATION: 8 weeks   PLANNED INTERVENTIONS: Therapeutic exercises, Therapeutic activity, Neuromuscular re-education, Balance training, Gait training, Patient/Family education, Self Care, Joint mobilization, Joint manipulation, Stair training, Aquatic Therapy, Dry Needling, Electrical stimulation, Spinal manipulation, Spinal mobilization, Cryotherapy, Moist heat, Taping, Traction, Ultrasound, Ionotophoresis 4mg /ml Dexamethasone, Manual therapy, and Re-evaluation.   PLAN FOR NEXT SESSION: DGI, HEP review     Durwin Reges DPT Durwin Reges, PT 07/19/2022, 1:40 PM

## 2022-07-21 ENCOUNTER — Encounter: Payer: Self-pay | Admitting: Physical Therapy

## 2022-07-21 ENCOUNTER — Ambulatory Visit: Payer: Medicare Other | Admitting: Physical Therapy

## 2022-07-21 DIAGNOSIS — M5459 Other low back pain: Secondary | ICD-10-CM | POA: Diagnosis not present

## 2022-07-21 NOTE — Therapy (Signed)
OUTPATIENT PHYSICAL THERAPY TREATMENT NOTE   Patient Name: Tamara Levine MRN: [id number] DOB:08/05/1936, 86 y.o., female Today's Date: 07/21/2022  PCP: Fulton Reek MD REFERRING PROVIDER: Fulton Reek MD  END OF SESSION:   PT End of Session - 07/21/22 1407     Visit Number 4    Number of Visits 17    Date for PT Re-Evaluation 09/09/22    Authorization - Visit Number 4    Authorization - Number of Visits 10    Progress Note Due on Visit 10    PT Start Time 2841    PT Stop Time 3244    PT Time Calculation (min) 48 min    Activity Tolerance Patient tolerated treatment well    Behavior During Therapy [facility] for tasks assessed/performed               History reviewed. No pertinent past medical history. Past Surgical History:  Procedure Laterality Date   ABDOMINAL HYSTERECTOMY     APPENDECTOMY     Patient Active Problem List   Diagnosis Date Noted   Abnormal glucose 08/05/2016   CKD (chronic kidney disease) stage 3, GFR 30-59 ml/min (HCC) 02/04/2016   Moderate mitral insufficiency 05/07/2015   Benign essential hypertension 04/27/2015   Chest pain 01/06/2015   SOB (shortness of breath) 05/20/2014   Anemia 04/29/2014   Chronic insomnia 04/29/2014   GERD (gastroesophageal reflux disease) 04/29/2014   Mixed hyperlipidemia 04/29/2014    REFERRING DIAG: LBP  THERAPY DIAG:  Other low back pain  Rationale for Evaluation and Treatment Rehabilitation  PERTINENT HISTORY: Pt is a 86 year old female presenting with acute on chronic LBP. Reports she does not recall a time her back did not hurt. She reports her back has started bothering her more in the past 6 months with insidious onset. Reports her LBP is R sided, in the low back and R buttock with radiating sore pain down the RLE . Denies any numbness/tingling. Reports the pain feels like a constant dull ache, but is made worse by standing >75mins, sitting without support, squatting, bending forward to reach low cabinet.  Has tried heat, ice, icy hot to make it feel better, they do not help. Has some relief with laying on her back. Worst and current pain 9/10; best 4/10. Pt is retired but enjoys doing house work, and yard work. She has had no falls, but feels unsteady when moving quickly. Does not use AD. Lives alone in a one story home (has a basement she does not need to go to) with level entry. Completes her own community errands. Does not have modifications to her bathroom. Pt denies N/V, B&B changes, unexplained weight fluctuation, saddle paresthesia, fever, night sweats, or unrelenting night pain at this time.  PRECAUTIONS: none  SUBJECTIVE: Pt reports she is doing well overall. She reports more soreness than true pain to date. Soreness is mostly in the buttock.   PAIN:  Are you having pain? Yes: NPRS scale: 5/10 Pain location: R hip/buttock   OBJECTIVE: (objective measures completed at initial evaluation unless otherwise dated)   PATIENT SURVEYS:  FOTO 47 Goal of: 56    SCREENING FOR RED FLAGS: Bowel or bladder incontinence: No Spinal tumors: No Cauda equina syndrome: No Compression fracture: No Abdominal aneurysm: No   COGNITION:           Overall cognitive status: Within functional limits for tasks assessed  SENSATION: WFL   MUSCLE LENGTH: Hamstrings: Right    Wnl deg; Left WNL deg Marcello Moores test: Right Shortened deg; Left shortened deg Elys shortened bilat   POSTURE: rounded shoulders, forward head, increased thoracic kyphosis, and flexed trunk lower crossed   PALPATION: Concordant pain to palpation over piriformis at glute max. Tension without pain to bilat lumbar paraspinals. Secondary pain with trigger points to superior glute max/glute med   LUMBAR ROM:    Active  A/PROM  eval  Flexion WNL  Extension 25% limited; reports "feels good"  Right lateral flexion    Left lateral flexion    Right rotation 25% limited  Left rotation 25% limited   (Blank rows  = not tested)   Bilat knee and ankle ROM WNL   Bilat Hip ext P/AROM: R: 10/0 L20/10   Bilat hip IR 25% limited P/AROM bilat   Other hip motions WNL   LOWER EXTREMITY ROM:      MMT Right eval Left eval  Hip flexion 4+ 4+  Hip extension 3 3+  Hip abduction 4- 4  Hip adduction 4+ 4+  Hip internal rotation 4 with LBP 4 with LBP  Hip external rotation 4 4  Knee flexion 4+ 4+  Knee extension 4+ 4+  Ankle dorsiflexion 5 5  Ankle plantarflexion      Ankle inversion      Ankle eversion       (Blank rows = not tested)       LUMBAR SPECIAL TESTS:  Straight leg raise test: Negative, Slump test: Negative, Quadrant test: Negative, Single leg stance test: Negative, FABER test: Negative, and Thomas test: Positive bilat FADIR positive bilat Piriformis test L neg R positive   FUNCTIONAL TESTS:  5 times sit to stand: 16sec 10 meter walk test: self selected: 0.68m/s  fastest: 1.18m/s  TUG 19sec    GAIT: Distance walked: 32ft Assistive device utilized: None Level of assistance: Modified independence Comments: decreased R stance time/L step length    TODAY'S TREATMENT 07/19/22 Nustep seat 7 UE 12 L2 15mins with cuing to maintain SPM >60 for gentle rotation and stretching TA alt toe taps 2x 12 with cuing throughout for technique with good carry over Birddog 2x 10 with min TC for stability occasionally Seated on dina disc TA marching 2x 12 (alt) with max cuing needed for core engagement with decent carry over Squat to elevated mat table with BUE flex of 5# DB 2x 10 with min cuing for full stand with full hip ext Unilateral farmers carry 10# DB 49ft each hand; min cuing for posture throughout Seated childs pose stretch 30sec    PATIENT EDUCATION:  Education details: Patient was educated on diagnosis, anatomy and pathology involved, prognosis, role of PT, and was given an HEP, demonstrating exercise with proper form following verbal and tactile cues, and was given a paper hand out to  continue exercise at home. Pt was educated on and agreed to plan of care. Person educated: Patient Education method: Explanation, Demonstration, Verbal cues, and Handouts Education comprehension: verbalized understanding, returned demonstration, and verbal cues required     HOME EXERCISE PROGRAM: Access Code: [email or web address] - Modified Thomas Stretch  - 2-3 x daily - 7 x weekly - 30sec hold - Seated Piriformis Stretch  - 2-3 x daily - 7 x weekly - 30sec hold   ASSESSMENT:   CLINICAL IMPRESSION: PT continued therex progression for core activation through functional movement positions with success. Patient with cuing needed for proper technique and core activation,  with good motivation, difficulty discerning this from hip flexor activation in sitting. PT will continue progression as able.       OBJECTIVE IMPAIRMENTS Abnormal gait, decreased activity tolerance, decreased balance, decreased coordination, decreased endurance, decreased mobility, difficulty walking, decreased ROM, decreased strength, hypomobility, increased fascial restrictions, impaired flexibility, impaired tone, improper body mechanics, postural dysfunction, and pain.    ACTIVITY LIMITATIONS lifting, bending, standing, squatting, stairs, transfers, and locomotion level   PARTICIPATION LIMITATIONS: meal prep, cleaning, laundry, driving, shopping, community activity, and yard work   PERSONAL FACTORS Age, Fitness, Past/current experiences, Time since onset of injury/illness/exacerbation, and 3+ comorbidities: HLD, SOB, ANEMIA, OA  are also affecting patient's functional outcome.    REHAB POTENTIAL: Good   CLINICAL DECISION MAKING: Evolving/moderate complexity   EVALUATION COMPLEXITY: Moderate     GOALS: Goals reviewed with patient? Yes   SHORT TERM GOALS: Target date: 08/09/2022   Pt will be independent with HEP in order to improve strength and balance in order to decrease fall risk and improve function at home and work.    Baseline: 07/12/22 HEP given  Goal status: INITIAL       LONG TERM GOALS: Target date: 09/06/2022   Patient will increase FOTO score to 56 to demonstrate predicted increase in functional mobility to complete ADLs   Baseline: 47 Goal status: INITIAL   2.  Pt will demonstrate TUG in 11sec or less in order to demonstrate age matched norm of decreased fall risk  Baseline: 19sec Goal status: INITIAL   3.  Pt will decrease worst pain as reported on NPRS by at least 3 points in order to demonstrate clinically significant reduction in pain.   Baseline: 07/12/22 9/10 Goal status: INITIAL   4.  Pt will increase 10MWT to at least 1.106m/s in order to demonstrate clinically significant decreased fall risk oc community ambulation.    Baseline: 07/12/22 0.54m/s Goal status: INITIAL   5.  Pt will decrease 5TSTS by at least 3 seconds in order to demonstrate clinically significant improvement in LE strength   Baseline: 07/12/22 16 Goal status: INITIAL         PLAN: PT FREQUENCY: 1-2x/week   PT DURATION: 8 weeks   PLANNED INTERVENTIONS: Therapeutic exercises, Therapeutic activity, Neuromuscular re-education, Balance training, Gait training, Patient/Family education, Self Care, Joint mobilization, Joint manipulation, Stair training, Aquatic Therapy, Dry Needling, Electrical stimulation, Spinal manipulation, Spinal mobilization, Cryotherapy, Moist heat, Taping, Traction, Ultrasound, Ionotophoresis 4mg /ml Dexamethasone, Manual therapy, and Re-evaluation.   PLAN FOR NEXT SESSION: DGI, HEP review     Durwin Reges DPT Durwin Reges, PT 07/21/2022, 2:38 PM

## 2022-07-25 ENCOUNTER — Encounter: Payer: Self-pay | Admitting: Physical Therapy

## 2022-07-25 ENCOUNTER — Ambulatory Visit: Payer: Medicare Other | Admitting: Physical Therapy

## 2022-07-25 DIAGNOSIS — M5459 Other low back pain: Secondary | ICD-10-CM

## 2022-07-25 NOTE — Therapy (Signed)
OUTPATIENT PHYSICAL THERAPY TREATMENT NOTE   Patient Name: Tamara Levine MRN: [id number] DOB:10/02/1936, 86 y.o., female Today's Date: 07/25/2022  PCP: Fulton Reek MD REFERRING PROVIDER: Fulton Reek MD  END OF SESSION:   PT End of Session - 07/25/22 1345     Visit Number 5    Number of Visits 17    Date for PT Re-Evaluation 09/09/22    Authorization - Visit Number 5    Authorization - Number of Visits 10    Progress Note Due on Visit 10    PT Start Time 1696    PT Stop Time 1420    PT Time Calculation (min) 38 min    Activity Tolerance Patient tolerated treatment well    Behavior During Therapy [facility] for tasks assessed/performed                History reviewed. No pertinent past medical history. Past Surgical History:  Procedure Laterality Date   ABDOMINAL HYSTERECTOMY     APPENDECTOMY     Patient Active Problem List   Diagnosis Date Noted   Abnormal glucose 08/05/2016   CKD (chronic kidney disease) stage 3, GFR 30-59 ml/min (HCC) 02/04/2016   Moderate mitral insufficiency 05/07/2015   Benign essential hypertension 04/27/2015   Chest pain 01/06/2015   SOB (shortness of breath) 05/20/2014   Anemia 04/29/2014   Chronic insomnia 04/29/2014   GERD (gastroesophageal reflux disease) 04/29/2014   Mixed hyperlipidemia 04/29/2014    REFERRING DIAG: LBP  THERAPY DIAG:  Other low back pain  Rationale for Evaluation and Treatment Rehabilitation  PERTINENT HISTORY: Pt is a 86 year old female presenting with acute on chronic LBP. Reports she does not recall a time her back did not hurt. She reports her back has started bothering her more in the past 6 months with insidious onset. Reports her LBP is R sided, in the low back and R buttock with radiating sore pain down the RLE . Denies any numbness/tingling. Reports the pain feels like a constant dull ache, but is made worse by standing >70mins, sitting without support, squatting, bending forward to reach low  cabinet. Has tried heat, ice, icy hot to make it feel better, they do not help. Has some relief with laying on her back. Worst and current pain 9/10; best 4/10. Pt is retired but enjoys doing house work, and yard work. She has had no falls, but feels unsteady when moving quickly. Does not use AD. Lives alone in a one story home (has a basement she does not need to go to) with level entry. Completes her own community errands. Does not have modifications to her bathroom. Pt denies N/V, B&B changes, unexplained weight fluctuation, saddle paresthesia, fever, night sweats, or unrelenting night pain at this time.  PRECAUTIONS: none  SUBJECTIVE: Pt reports she was able to stand to make banana bread and do laundry with no pain which she is happy with. Reports 4/10 pain today. Compliance with HEP.   PAIN:  Are you having pain? Yes: NPRS scale: 4/10 Pain location: R hip/buttock   OBJECTIVE: (objective measures completed at initial evaluation unless otherwise dated)   PATIENT SURVEYS:  FOTO 47 Goal of: 56    SCREENING FOR RED FLAGS: Bowel or bladder incontinence: No Spinal tumors: No Cauda equina syndrome: No Compression fracture: No Abdominal aneurysm: No   COGNITION:           Overall cognitive status: Within functional limits for tasks assessed  SENSATION: WFL   MUSCLE LENGTH: Hamstrings: Right    Wnl deg; Left WNL deg Marcello Moores test: Right Shortened deg; Left shortened deg Elys shortened bilat   POSTURE: rounded shoulders, forward head, increased thoracic kyphosis, and flexed trunk lower crossed   PALPATION: Concordant pain to palpation over piriformis at glute max. Tension without pain to bilat lumbar paraspinals. Secondary pain with trigger points to superior glute max/glute med   LUMBAR ROM:    Active  A/PROM  eval  Flexion WNL  Extension 25% limited; reports "feels good"  Right lateral flexion    Left lateral flexion    Right rotation 25% limited   Left rotation 25% limited   (Blank rows = not tested)   Bilat knee and ankle ROM WNL   Bilat Hip ext P/AROM: R: 10/0 L20/10   Bilat hip IR 25% limited P/AROM bilat   Other hip motions WNL   LOWER EXTREMITY ROM:      MMT Right eval Left eval  Hip flexion 4+ 4+  Hip extension 3 3+  Hip abduction 4- 4  Hip adduction 4+ 4+  Hip internal rotation 4 with LBP 4 with LBP  Hip external rotation 4 4  Knee flexion 4+ 4+  Knee extension 4+ 4+  Ankle dorsiflexion 5 5  Ankle plantarflexion      Ankle inversion      Ankle eversion       (Blank rows = not tested)       LUMBAR SPECIAL TESTS:  Straight leg raise test: Negative, Slump test: Negative, Quadrant test: Negative, Single leg stance test: Negative, FABER test: Negative, and Thomas test: Positive bilat FADIR positive bilat Piriformis test L neg R positive   FUNCTIONAL TESTS:  5 times sit to stand: 16sec 10 meter walk test: self selected: 0.81m/s  fastest: 1.63m/s  TUG 19sec    GAIT: Distance walked: 30ft Assistive device utilized: None Level of assistance: Modified independence Comments: decreased R stance time/L step length    TODAY'S TREATMENT 07/19/22 Nustep seat 7 UE 12 L2 31mins with cuing to maintain SPM >60 for gentle rotation and stretching Seated on dina disc TA marching 2x 12 (alt) with min cuing for sequencing with good carry over Seated on dina disc rotation with 5# DB 2x 12 (alt) with good carry over of demo and initial cuing Squat to elevated mat table with BUE flex of 5# DB 2x 10 with min cuing for full stand with full hip ext Unilateral farmers carry 10# DB 2x 52ft each hand; min cuing for posture throughout Seated childs pose stretch 30sec    PATIENT EDUCATION:  Education details: Patient was educated on diagnosis, anatomy and pathology involved, prognosis, role of PT, and was given an HEP, demonstrating exercise with proper form following verbal and tactile cues, and was given a paper hand out to  continue exercise at home. Pt was educated on and agreed to plan of care. Person educated: Patient Education method: Explanation, Demonstration, Verbal cues, and Handouts Education comprehension: verbalized understanding, returned demonstration, and verbal cues required     HOME EXERCISE PROGRAM: Access Code: [email or web address] - Modified Thomas Stretch  - 2-3 x daily - 7 x weekly - 30sec hold - Seated Piriformis Stretch  - 2-3 x daily - 7 x weekly - 30sec hold   ASSESSMENT:   CLINICAL IMPRESSION: PT continued therex progression for core activation through functional movement positions with success. Patient is able to comply with all cuing for proper technique of therex with good  effort throughout session. Pt reports no increased pain throughout session.  PT will continue progression as able.       OBJECTIVE IMPAIRMENTS Abnormal gait, decreased activity tolerance, decreased balance, decreased coordination, decreased endurance, decreased mobility, difficulty walking, decreased ROM, decreased strength, hypomobility, increased fascial restrictions, impaired flexibility, impaired tone, improper body mechanics, postural dysfunction, and pain.    ACTIVITY LIMITATIONS lifting, bending, standing, squatting, stairs, transfers, and locomotion level   PARTICIPATION LIMITATIONS: meal prep, cleaning, laundry, driving, shopping, community activity, and yard work   PERSONAL FACTORS Age, Fitness, Past/current experiences, Time since onset of injury/illness/exacerbation, and 3+ comorbidities: HLD, SOB, ANEMIA, OA  are also affecting patient's functional outcome.    REHAB POTENTIAL: Good   CLINICAL DECISION MAKING: Evolving/moderate complexity   EVALUATION COMPLEXITY: Moderate     GOALS: Goals reviewed with patient? Yes   SHORT TERM GOALS: Target date: 08/09/2022   Pt will be independent with HEP in order to improve strength and balance in order to decrease fall risk and improve function at home and work.    Baseline: 07/12/22 HEP given  Goal status: INITIAL       LONG TERM GOALS: Target date: 09/06/2022   Patient will increase FOTO score to 56 to demonstrate predicted increase in functional mobility to complete ADLs   Baseline: 47 Goal status: INITIAL   2.  Pt will demonstrate TUG in 11sec or less in order to demonstrate age matched norm of decreased fall risk  Baseline: 19sec Goal status: INITIAL   3.  Pt will decrease worst pain as reported on NPRS by at least 3 points in order to demonstrate clinically significant reduction in pain.   Baseline: 07/12/22 9/10 Goal status: INITIAL   4.  Pt will increase 10MWT to at least 1.48m/s in order to demonstrate clinically significant decreased fall risk oc community ambulation.    Baseline: 07/12/22 0.59m/s Goal status: INITIAL   5.  Pt will decrease 5TSTS by at least 3 seconds in order to demonstrate clinically significant improvement in LE strength   Baseline: 07/12/22 16 Goal status: INITIAL         PLAN: PT FREQUENCY: 1-2x/week   PT DURATION: 8 weeks   PLANNED INTERVENTIONS: Therapeutic exercises, Therapeutic activity, Neuromuscular re-education, Balance training, Gait training, Patient/Family education, Self Care, Joint mobilization, Joint manipulation, Stair training, Aquatic Therapy, Dry Needling, Electrical stimulation, Spinal manipulation, Spinal mobilization, Cryotherapy, Moist heat, Taping, Traction, Ultrasound, Ionotophoresis 4mg /ml Dexamethasone, Manual therapy, and Re-evaluation.   PLAN FOR NEXT SESSION: DGI, HEP review     Durwin Reges DPT Durwin Reges, PT 07/25/2022, 2:17 PM

## 2022-07-27 ENCOUNTER — Ambulatory Visit: Payer: Medicare Other | Admitting: Physical Therapy

## 2022-08-01 ENCOUNTER — Ambulatory Visit: Payer: Medicare Other | Admitting: Physical Therapy

## 2022-08-01 ENCOUNTER — Encounter: Payer: Self-pay | Admitting: Physical Therapy

## 2022-08-01 DIAGNOSIS — M5459 Other low back pain: Secondary | ICD-10-CM | POA: Diagnosis not present

## 2022-08-01 NOTE — Therapy (Signed)
OUTPATIENT PHYSICAL THERAPY TREATMENT NOTE/DC SUMMARY Reporting Period 07/12/22 - 08/01/22    Patient Name: Tamara Levine MRN: [id number] DOB:1936-02-20, 86 y.o., female Today's Date: 08/01/2022  PCP: Fulton Reek MD REFERRING PROVIDER: Fulton Reek MD  END OF SESSION:   PT End of Session - 08/01/22 1433     Visit Number 6    Number of Visits 17    Date for PT Re-Evaluation 09/09/22    Authorization - Visit Number 6    Authorization - Number of Visits 10    Progress Note Due on Visit 10    PT Start Time 1430    PT Stop Time 1500    PT Time Calculation (min) 30 min    Activity Tolerance Patient tolerated treatment well    Behavior During Therapy [facility] for tasks assessed/performed                 History reviewed. No pertinent past medical history. Past Surgical History:  Procedure Laterality Date   ABDOMINAL HYSTERECTOMY     APPENDECTOMY     Patient Active Problem List   Diagnosis Date Noted   Abnormal glucose 08/05/2016   CKD (chronic kidney disease) stage 3, GFR 30-59 ml/min (HCC) 02/04/2016   Moderate mitral insufficiency 05/07/2015   Benign essential hypertension 04/27/2015   Chest pain 01/06/2015   SOB (shortness of breath) 05/20/2014   Anemia 04/29/2014   Chronic insomnia 04/29/2014   GERD (gastroesophageal reflux disease) 04/29/2014   Mixed hyperlipidemia 04/29/2014    REFERRING DIAG: LBP  THERAPY DIAG:  Other low back pain  Rationale for Evaluation and Treatment Rehabilitation  PERTINENT HISTORY: Pt is a 86 year old female presenting with acute on chronic LBP. Reports she does not recall a time her back did not hurt. She reports her back has started bothering her more in the past 6 months with insidious onset. Reports her LBP is R sided, in the low back and R buttock with radiating sore pain down the RLE . Denies any numbness/tingling. Reports the pain feels like a constant dull ache, but is made worse by standing >32mns, sitting without  support, squatting, bending forward to reach low cabinet. Has tried heat, ice, icy hot to make it feel better, they do not help. Has some relief with laying on her back. Worst and current pain 9/10; best 4/10. Pt is retired but enjoys doing house work, and yard work. She has had no falls, but feels unsteady when moving quickly. Does not use AD. Lives alone in a one story home (has a basement she does not need to go to) with level entry. Completes her own community errands. Does not have modifications to her bathroom. Pt denies N/V, B&B changes, unexplained weight fluctuation, saddle paresthesia, fever, night sweats, or unrelenting night pain at this time.  PRECAUTIONS: none  SUBJECTIVE: Pt reports she was able to stand to make banana bread and do laundry with no pain which she is happy with. Reports 4/10 pain today. Compliance with HEP.   PAIN:  Are you having pain? Yes: NPRS scale: 4/10 Pain location: R hip/buttock   OBJECTIVE: (objective measures completed at initial evaluation unless otherwise dated)   PATIENT SURVEYS:  FOTO 47 Goal of: 56    SCREENING FOR RED FLAGS: Bowel or bladder incontinence: No Spinal tumors: No Cauda equina syndrome: No Compression fracture: No Abdominal aneurysm: No   COGNITION:           Overall cognitive status: Within functional limits for tasks assessed  SENSATION: WFL   MUSCLE LENGTH: Hamstrings: Right    Wnl deg; Left WNL deg Marcello Moores test: Right Shortened deg; Left shortened deg Elys shortened bilat   POSTURE: rounded shoulders, forward head, increased thoracic kyphosis, and flexed trunk lower crossed   PALPATION: Concordant pain to palpation over piriformis at glute max. Tension without pain to bilat lumbar paraspinals. Secondary pain with trigger points to superior glute max/glute med   LUMBAR ROM:    Active  A/PROM  eval  Flexion WNL  Extension 25% limited; reports "feels good"  Right lateral flexion    Left  lateral flexion    Right rotation 25% limited  Left rotation 25% limited   (Blank rows = not tested)   Bilat knee and ankle ROM WNL   Bilat Hip ext P/AROM: R: 10/0 L20/10   Bilat hip IR 25% limited P/AROM bilat   Other hip motions WNL   LOWER EXTREMITY ROM:      MMT Right eval Left eval  Hip flexion 4+ 4+  Hip extension 3 3+  Hip abduction 4- 4  Hip adduction 4+ 4+  Hip internal rotation 4 with LBP 4 with LBP  Hip external rotation 4 4  Knee flexion 4+ 4+  Knee extension 4+ 4+  Ankle dorsiflexion 5 5  Ankle plantarflexion      Ankle inversion      Ankle eversion       (Blank rows = not tested)       LUMBAR SPECIAL TESTS:  Straight leg raise test: Negative, Slump test: Negative, Quadrant test: Negative, Single leg stance test: Negative, FABER test: Negative, and Thomas test: Positive bilat FADIR positive bilat Piriformis test L neg R positive   FUNCTIONAL TESTS:  5 times sit to stand: 16sec 10 meter walk test: self selected: 0.36ms  fastest: 1.164m  TUG 19sec    GAIT: Distance walked: [id number] device utilized: None Level of assistance: Modified independence Comments: decreased R stance time/L step length    TODAY'S TREATMENT 07/19/22 Nustep seat 7 UE 12 L2 5mi37mwith cuing to maintain SPM >60 for gentle rotation and stretching  5XSTS TUG  PT reviewed the following HEP with patient with patient able to demonstrate a set of the following with min cuing for correction needed. PT educated patient on parameters of therex (how/when to inc/decrease intensity, frequency, rep/set range, stretch hold time, and purpose of therex) with verbalized understanding.  Access Code: [email or web address] Landh Chair Touch  - 1 x daily - 2 x weekly - 3 sets - 10-12 reps - Bird Dog  - 1 x daily - 2 x weekly - 3 sets - 10-20 reps - Supine Bridge  - 1 x daily - 2 x weekly - 3 sets - 10 reps - Farmer's Carry with Kettlebells  - 1 x daily - 2 x weekly - 3 reps    PATIENT  EDUCATION:  Education details: Patient was educated on diagnosis, anatomy and pathology involved, prognosis, role of PT, and was given an HEP, demonstrating exercise with proper form following verbal and tactile cues, and was given a paper hand out to continue exercise at home. Pt was educated on and agreed to plan of care. Person educated: Patient Education method: Explanation, Demonstration, Verbal cues, and Handouts Education comprehension: verbalized understanding, returned demonstration, and verbal cues required     HOME EXERCISE PROGRAM: Access Code: [email or web address] - Modified Thomas Stretch  - 2-3 x daily - 7 x weekly - 30sec hold - Seated Piriformis  Stretch  - 2-3 x daily - 7 x weekly - 30sec hold   ASSESSMENT:   CLINICAL IMPRESSION: PT reassessed goals this session where patient has met all goals to safely d/c formal PT. Patient is able to demonstrate and verbalize understanding of all HEP recommendations with minimal corrections needed. Pt given clinic contact info should further questions or concerns arise. Pt to d/c PT.        OBJECTIVE IMPAIRMENTS Abnormal gait, decreased activity tolerance, decreased balance, decreased coordination, decreased endurance, decreased mobility, difficulty walking, decreased ROM, decreased strength, hypomobility, increased fascial restrictions, impaired flexibility, impaired tone, improper body mechanics, postural dysfunction, and pain.    ACTIVITY LIMITATIONS lifting, bending, standing, squatting, stairs, transfers, and locomotion level   PARTICIPATION LIMITATIONS: meal prep, cleaning, laundry, driving, shopping, community activity, and yard work   PERSONAL FACTORS Age, Fitness, Past/current experiences, Time since onset of injury/illness/exacerbation, and 3+ comorbidities: HLD, SOB, ANEMIA, OA  are also affecting patient's functional outcome.    REHAB POTENTIAL: Good   CLINICAL DECISION MAKING: Evolving/moderate complexity   EVALUATION COMPLEXITY:  Moderate     GOALS: Goals reviewed with patient? Yes   SHORT TERM GOALS: Target date: 08/09/2022   Pt will be independent with HEP in order to improve strength and balance in order to decrease fall risk and improve function at home and work.   Baseline: 07/12/22 HEP given  Goal status: INITIAL       LONG TERM GOALS: Target date: 09/06/2022   Patient will increase FOTO score to 56 to demonstrate predicted increase in functional mobility to complete ADLs   Baseline: 47 08/01/22 71 Goal status: achieved    2.  Pt will demonstrate TUG in 11sec or less in order to demonstrate age matched norm of decreased fall risk  Baseline: 19sec 08/01/22 10sec Goal status: achieved    3.  Pt will decrease worst pain as reported on NPRS by at least 3 points in order to demonstrate clinically significant reduction in pain.   Baseline: 07/12/22 9/10; 08/01/22 0/10 Goal status: achieved    4.  Pt will increase 10MWT to at least 1.53ms in order to demonstrate clinically significant decreased fall risk oc community ambulation.    Baseline: 07/12/22 0.925m 08/01/22 1.1185mGoal status: achieved    5.  Pt will decrease 5TSTS by at least 3 seconds in order to demonstrate clinically significant improvement in LE strength   Baseline: 07/12/22 16sec 08/01/22 11sec Goal status: achieved          PLAN: PT FREQUENCY: 1-2x/week   PT DURATION: 8 weeks   PLANNED INTERVENTIONS: Therapeutic exercises, Therapeutic activity, Neuromuscular re-education, Balance training, Gait training, Patient/Family education, Self Care, Joint mobilization, Joint manipulation, Stair training, Aquatic Therapy, Dry Needling, Electrical stimulation, Spinal manipulation, Spinal mobilization, Cryotherapy, Moist heat, Taping, Traction, Ultrasound, Ionotophoresis 4mg80m Dexamethasone, Manual therapy, and Re-evaluation.   PLAN FOR NEXT SESSION: DGI, HEP review     ChelDurwin Reges ChelDurwin Reges 08/01/2022, 3:04 PM

## 2022-08-03 ENCOUNTER — Ambulatory Visit: Payer: Medicare Other | Admitting: Physical Therapy

## 2022-08-08 ENCOUNTER — Encounter: Payer: Medicare Other | Admitting: Physical Therapy

## 2022-08-09 ENCOUNTER — Encounter: Payer: Medicare Other | Admitting: Physical Therapy

## 2022-08-10 ENCOUNTER — Encounter: Payer: Medicare Other | Admitting: Physical Therapy

## 2022-08-11 ENCOUNTER — Encounter: Payer: Medicare Other | Admitting: Physical Therapy

## 2022-08-16 ENCOUNTER — Encounter: Payer: Medicare Other | Admitting: Physical Therapy

## 2022-08-18 ENCOUNTER — Encounter: Payer: Medicare Other | Admitting: Physical Therapy
